# Patient Record
Sex: Female | Born: 1970 | Race: White | Hispanic: No | Marital: Single | Smoking: Never smoker
Health system: Southern US, Community
[De-identification: ages and names within clinical notes are randomized; demographics above are authoritative.]

## PROBLEM LIST (undated history)

## (undated) DIAGNOSIS — F419 Anxiety disorder, unspecified: Secondary | ICD-10-CM

## (undated) DIAGNOSIS — T8859XA Other complications of anesthesia, initial encounter: Secondary | ICD-10-CM

## (undated) DIAGNOSIS — T4145XA Adverse effect of unspecified anesthetic, initial encounter: Secondary | ICD-10-CM

## (undated) DIAGNOSIS — C50919 Malignant neoplasm of unspecified site of unspecified female breast: Secondary | ICD-10-CM

## (undated) DIAGNOSIS — Z9889 Other specified postprocedural states: Secondary | ICD-10-CM

## (undated) DIAGNOSIS — D649 Anemia, unspecified: Secondary | ICD-10-CM

## (undated) DIAGNOSIS — R112 Nausea with vomiting, unspecified: Secondary | ICD-10-CM

## (undated) DIAGNOSIS — Z923 Personal history of irradiation: Secondary | ICD-10-CM

## (undated) DIAGNOSIS — Z803 Family history of malignant neoplasm of breast: Principal | ICD-10-CM

## (undated) DIAGNOSIS — G43909 Migraine, unspecified, not intractable, without status migrainosus: Secondary | ICD-10-CM

## (undated) HISTORY — DX: Personal history of irradiation: Z92.3

## (undated) HISTORY — PX: BREAST LUMPECTOMY: SHX2

## (undated) HISTORY — DX: Malignant neoplasm of unspecified site of unspecified female breast: C50.919

## (undated) HISTORY — PX: WISDOM TOOTH EXTRACTION: SHX21

## (undated) HISTORY — PX: OTHER SURGICAL HISTORY: SHX169

## (undated) HISTORY — DX: Anemia, unspecified: D64.9

## (undated) HISTORY — DX: Migraine, unspecified, not intractable, without status migrainosus: G43.909

## (undated) HISTORY — DX: Family history of malignant neoplasm of breast: Z80.3

## (undated) HISTORY — PX: DILATION AND CURETTAGE OF UTERUS: SHX78

## (undated) HISTORY — PX: CLOSED REDUCTION ELBOW DISLOCATION: SUR215

## (undated) HISTORY — DX: Anxiety disorder, unspecified: F41.9

## (undated) SURGERY — EXCISION, LESION, BREAST
Anesthesia: General | Laterality: Right

---

## 1998-02-18 ENCOUNTER — Inpatient Hospital Stay (HOSPITAL_COMMUNITY): Admission: AD | Admit: 1998-02-18 | Discharge: 1998-02-18 | Payer: Self-pay | Admitting: Obstetrics and Gynecology

## 1998-07-05 ENCOUNTER — Ambulatory Visit (HOSPITAL_COMMUNITY): Admission: RE | Admit: 1998-07-05 | Discharge: 1998-07-05 | Payer: Self-pay | Admitting: Obstetrics and Gynecology

## 1998-07-19 ENCOUNTER — Inpatient Hospital Stay (HOSPITAL_COMMUNITY): Admission: AD | Admit: 1998-07-19 | Discharge: 1998-07-22 | Payer: Self-pay | Admitting: Obstetrics and Gynecology

## 1998-08-06 ENCOUNTER — Encounter (HOSPITAL_COMMUNITY): Admission: RE | Admit: 1998-08-06 | Discharge: 1998-09-21 | Payer: Self-pay | Admitting: Obstetrics and Gynecology

## 1998-08-20 ENCOUNTER — Other Ambulatory Visit: Admission: RE | Admit: 1998-08-20 | Discharge: 1998-08-20 | Payer: Self-pay | Admitting: Obstetrics and Gynecology

## 1999-09-16 ENCOUNTER — Other Ambulatory Visit: Admission: RE | Admit: 1999-09-16 | Discharge: 1999-09-16 | Payer: Self-pay | Admitting: Obstetrics and Gynecology

## 2000-04-18 ENCOUNTER — Encounter: Payer: Self-pay | Admitting: Emergency Medicine

## 2000-04-18 ENCOUNTER — Emergency Department (HOSPITAL_COMMUNITY): Admission: EM | Admit: 2000-04-18 | Discharge: 2000-04-18 | Payer: Self-pay | Admitting: Emergency Medicine

## 2002-01-19 ENCOUNTER — Ambulatory Visit (HOSPITAL_COMMUNITY): Admission: RE | Admit: 2002-01-19 | Discharge: 2002-01-19 | Payer: Self-pay | Admitting: Obstetrics and Gynecology

## 2002-01-19 ENCOUNTER — Encounter (INDEPENDENT_AMBULATORY_CARE_PROVIDER_SITE_OTHER): Payer: Self-pay | Admitting: Specialist

## 2002-11-28 ENCOUNTER — Other Ambulatory Visit: Admission: RE | Admit: 2002-11-28 | Discharge: 2002-11-28 | Payer: Self-pay | Admitting: Obstetrics and Gynecology

## 2003-06-06 ENCOUNTER — Inpatient Hospital Stay (HOSPITAL_COMMUNITY): Admission: AD | Admit: 2003-06-06 | Discharge: 2003-06-06 | Payer: Self-pay | Admitting: Obstetrics and Gynecology

## 2003-06-06 ENCOUNTER — Encounter: Payer: Self-pay | Admitting: Obstetrics & Gynecology

## 2003-06-09 ENCOUNTER — Inpatient Hospital Stay (HOSPITAL_COMMUNITY): Admission: AD | Admit: 2003-06-09 | Discharge: 2003-06-12 | Payer: Self-pay | Admitting: Obstetrics and Gynecology

## 2003-07-12 ENCOUNTER — Other Ambulatory Visit: Admission: RE | Admit: 2003-07-12 | Discharge: 2003-07-12 | Payer: Self-pay | Admitting: Obstetrics and Gynecology

## 2004-08-19 ENCOUNTER — Other Ambulatory Visit: Admission: RE | Admit: 2004-08-19 | Discharge: 2004-08-19 | Payer: Self-pay | Admitting: Obstetrics and Gynecology

## 2006-06-16 ENCOUNTER — Encounter: Admission: RE | Admit: 2006-06-16 | Discharge: 2006-06-16 | Payer: Self-pay | Admitting: Obstetrics and Gynecology

## 2008-11-24 ENCOUNTER — Encounter: Admission: RE | Admit: 2008-11-24 | Discharge: 2008-11-24 | Payer: Self-pay | Admitting: Family Medicine

## 2010-11-03 ENCOUNTER — Encounter: Payer: Self-pay | Admitting: Obstetrics and Gynecology

## 2011-02-28 NOTE — Op Note (Signed)
[facility]  Patient:    Ruth Nguyen, Ruth Nguyen Visit Number: [id number] MRN: [id number]          Service Type: DSU Location: [facility] Attending Physician:  Soledad Gerlach Dictated by:   Guy Sandifer Arleta Creek, M.D. Proc. Date: 01/19/02 Admit Date:  01/19/2002                             Operative Report  PREOPERATIVE DIAGNOSES:       Missed abortion.  POSTOPERATIVE DIAGNOSES:      Missed abortion.  PROCEDURE:                    Dilatation and evacuation.  SURGEON:                      Guy Sandifer. Arleta Creek, M.D.  ANESTHESIA:                   MAC with 1% Xylocaine paracervical block.  ESTIMATED BLOOD LOSS:         50 cc.  INDICATIONS AND CONSENT:      This patient is a 40 year old married white female G2, P1 found to have a missed abortion on ultrasound in the office today.  No heartbeat was noted on prolonged observation.  Crown rump length was 9 weeks and 3 days.  Options were discussed with the patient.  Dilatation and evacuation is discussed.  Potential risks and complications have been discussed including, but not limited to, infection, uterine perforation and organ damage, laparoscopy, laparotomy, bleeding requiring transfusion of blood products with possible transfusion reaction, HIV and hepatitis acquisition, DVT, PE, pneumonia, hysterectomy, intrauterine synechiae, and secondary infertility.  All questions have been answered and consent is signed on the chart.  PROCEDURE:                    Patient is taken to the operating room where she is given IV sedation.  She is placed in the dorsal lithotomy position where she is prepped and draped in a sterile fashion.  Examination reveals the uterus to be anteverted, approximately 9 weeks in size.  Cervix is closed. Bivalve speculum was placed in the vagina and the anterior cervical lip is injected with 1% Xylocaine and grasped with a single tooth tenaculum. Paracervical block is then placed  at the 2, 4, 5, 7, 8, and 10 oclock positions with approximately 20 cc total of 1% Xylocaine.  Cervix is then gently progressively dilated to a 33 Pratt dilator.  A #8 suction curettage is then placed in the intrauterine cavity and suction curettage is carried out for obvious products of conception.  Pitocin 20 units are added to 1 L of IV fluids after the initial pass of the suction curette.  Continuing suction curettage was carried out and then alternating sharp and suction curettage was carried out and the uterine cavity was clean.  Good hemostasis is noted.  All instruments are removed.  Good hemostasis is noted on the cervix.  All counts are correct.  The patient is awakened and taken to recovery room in stable condition.  Patient is Rh positive.  She will be discharged to home on Methergine 0.2 mg #6 one p.o. t.i.d.  She did receive Ancef 1 g preoperatively. Dictated by:   Guy Sandifer Arleta Creek, M.D. Attending Physician:  Soledad Gerlach DD:  01/19/02 TD:  01/20/02 Job: [id number]  [id number]

## 2011-02-28 NOTE — H&P (Signed)
[facility]  Patient:    Ruth Nguyen, Ruth Nguyen Visit Number: [id number] MRN: [id number]          Service Type: DSU Location: [facility] Attending Physician:  Soledad Gerlach Dictated by:   Guy Sandifer Arleta Creek, M.D. Admit Date:  01/19/2002                           History and Physical  CHIEF COMPLAINT:              Missed abortion.  HISTORY OF PRESENT ILLNESS:   This patient is a 40 year old married white female, G2, P1, with a last menstrual period somewhere in the end of January. She had scant bleeding at the beginning of pregnancy.  Upon presentation of her new OB visit today, she was noted to have crown rumpling of 9 weeks and three days.  No fetal heart beat was noted upon prolonged observation. Diagnosis of miscarriage was made.  Options were discussed.  The patient scheduled for dilatation and evacuation.  The potential risks and complications have been discussed, and all questions have been answered.  PAST MEDICAL HISTORY:         1. Superficial varicosities.                               2. History of iron deficiency anemia.                               3. Irritable bowel syndrome.                               4. Migraine headaches.  PAST SURGICAL HISTORY:        Upper GI with endoscopy in 1998.  OBSTETRIC HISTORY:            Vaginal delivery x1.  FAMILY HISTORY:               Positive for superficial varicosities in maternal grandmother and maternal aunt.  Borderline diabetes in maternal grandfather.  Rheumatoid arthritis in maternal aunt.  Stroke in maternal grandfather, paternal grandmother, paternal grandfather.  Parkinsons disease in maternal grandfather and paternal grandmother.  Alzheimers disease in maternal great-grandmother.  B12 deficiency in patients mother, maternal grandmother and all maternal aunts.  SOCIAL HISTORY:               The patient is employed as a Armed forces training and education officer.  Denies tobacco, alcohol or drug  abuse.  MEDICATIONS:                  Prenatal vitamins.  ALLERGIES:                    No known drug allergies.  REVIEW OF SYSTEMS             Negative, except as above.  PHYSICAL EXAMINATION:  VITAL SIGNS:                  Height 5 feet 4.5 inches.  Blood pressure 110/70.  LUNGS:                        Clear to auscultation.  HEART:  Regular rate and rhythm.  BACK:                         Without CVA tenderness.  BREASTS:                      Not examined.  ABDOMEN:                      Nontender without masses.  PELVIC:                       Deferred until surgery.  EXTREMITIES:                  Grossly within normal limits.  NEUROLOGIC:                   Grossly within normal limits.  LABORATORY:                   Blood type 0 positive.  ASSESSMENT:                   Missed abortion.  PLAN:                         Dilatation and evacuation. Dictated by:   Guy Sandifer Arleta Creek, M.D. Attending Physician:  Soledad Gerlach DD:  01/19/02 TD:  01/19/02 Job: [id number]

## 2011-08-08 ENCOUNTER — Other Ambulatory Visit: Payer: Self-pay | Admitting: Family Medicine

## 2011-08-08 DIAGNOSIS — R0989 Other specified symptoms and signs involving the circulatory and respiratory systems: Secondary | ICD-10-CM

## 2011-08-14 ENCOUNTER — Ambulatory Visit
Admission: RE | Admit: 2011-08-14 | Discharge: 2011-08-14 | Disposition: A | Discharge status: Home / Self Care | Payer: BC Managed Care – PPO | Source: Ambulatory Visit | Attending: Family Medicine | Admitting: Family Medicine

## 2011-08-14 DIAGNOSIS — R0989 Other specified symptoms and signs involving the circulatory and respiratory systems: Secondary | ICD-10-CM

## 2012-06-05 ENCOUNTER — Ambulatory Visit (INDEPENDENT_AMBULATORY_CARE_PROVIDER_SITE_OTHER): Payer: BC Managed Care – PPO | Admitting: Emergency Medicine

## 2012-06-05 VITALS — BP 112/80 | HR 85 | Temp 98.1°F | Resp 18 | Ht 65.5 in | Wt 223.0 lb

## 2012-06-05 DIAGNOSIS — J018 Other acute sinusitis: Secondary | ICD-10-CM

## 2012-06-05 MED ORDER — CEFPROZIL 500 MG PO TABS: 500.0000 mg | ORAL_TABLET | Freq: Two times a day (BID) | ORAL | Status: AC

## 2012-06-05 MED ORDER — PSEUDOEPHEDRINE-GUAIFENESIN ER 60-600 MG PO TB12: 1.0000 | ORAL_TABLET | Freq: Two times a day (BID) | ORAL | Status: AC

## 2012-06-05 NOTE — Progress Notes (Signed)
   Date:  06/05/2012   Name:  Ruth Nguyen   DOB:  04-Aug-1971   MRN:  [id number] Gender: female  Age: 41 y.o.  PCP:  Ricci Barker, PA    Chief Complaint: Headache, Sore Throat and Otalgia   History of Present Illness:  Ruth Nguyen is a 41 y.o. pleasant patient who presents with the following:  History of migraines and has headache past several days that has improved with vicodin.  Has sore throat, hoarseness and pain and pressure in ears.  Sore neck with swollen lymph nodes.  No fever or chills or nausea or vomiting.  No neuro or visual symptoms.  Symptoms have improved with otc meds.  Myalgias and arthralgias.  There is no problem list on file for this patient.   No past medical history on file.  No past surgical history on file.  History  Substance Use Topics  . Smoking status: Never Smoker   . Smokeless tobacco: Not on file  . Alcohol Use: Not on file    No family history on file.  Allergies  Allergen Reactions  . Penicillins     Medication list has been reviewed and updated.  Current Outpatient Prescriptions on File Prior to Visit  Medication Sig Dispense Refill  . buPROPion (WELLBUTRIN SR) 100 MG 12 hr tablet Take 100 mg by mouth 2 (two) times daily.      . sertraline (ZOLOFT) 20 MG/ML concentrated solution Take 10 mg by mouth daily.      . SUMAtriptan (IMITREX) 25 MG tablet Take 25 mg by mouth every 2 (two) hours as needed.        Review of Systems:  As per HPI, otherwise negative.    Physical Examination: Filed Vitals:   06/05/12 1519  BP: 112/80  Pulse: 85  Temp: 98.1 F (36.7 C)  Resp: 18   Filed Vitals:   06/05/12 1519  Height: 5' 5.5" (1.664 m)  Weight: 223 lb (101.152 kg)   Body mass index is 36.54 kg/(m^2). Ideal Body Weight: Weight in (lb) to have BMI = 25: 152.2    GEN: WDWN, NAD, Non-toxic, Alert & Oriented x 3 HEENT: Atraumatic, Normocephalic. Tender over maxillary sinuses.  Oropharynx normal with postpharyngeal  purulent drainage Ears and Nose: No external deformity.  TM's normal. EXTR: No clubbing/cyanosis/edema NEURO: Normal gait.  PSYCH: Normally interactive. Conversant. Not depressed or anxious appearing.  Calm demeanor.  Chest CTA   Assessment and Plan: Sinusitis cefzil  mucinex d Follow up as needed   Carmelina Dane, MD

## 2012-09-01 ENCOUNTER — Other Ambulatory Visit: Payer: Self-pay | Admitting: Obstetrics and Gynecology

## 2012-12-26 ENCOUNTER — Ambulatory Visit (INDEPENDENT_AMBULATORY_CARE_PROVIDER_SITE_OTHER): Payer: BC Managed Care – PPO | Admitting: Family Medicine

## 2012-12-26 VITALS — HR 89 | Temp 98.3°F | Resp 19 | Ht 64.25 in | Wt 223.4 lb

## 2012-12-26 VITALS — BP 114/85 | HR 89 | Temp 98.3°F | Resp 18 | Ht 64.25 in | Wt 223.4 lb

## 2012-12-26 DIAGNOSIS — K120 Recurrent oral aphthae: Secondary | ICD-10-CM

## 2012-12-26 DIAGNOSIS — H9209 Otalgia, unspecified ear: Secondary | ICD-10-CM

## 2012-12-26 MED ORDER — FIRST-DUKES MOUTHWASH MT SUSP: 10.0000 mL | OROMUCOSAL | Status: DC | PRN

## 2012-12-26 NOTE — Progress Notes (Signed)
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  Objective: There is a half centimeter long ulcer underneath her left tongue with corresponding left-sided submandibular adenopathy. TM is normal. Skin is normal. Oropharynx is otherwise unremarkable  Assessment: Aphthous ulcer  Plan: Dukes mouthwash.

## 2012-12-26 NOTE — Progress Notes (Signed)
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 The last 2 days she's developed a sore in her left tongue with radiation into the ear and down the neck. She's had no fever, cough, or change in appetite. She has noted some in intermittent epistaxis.  Objective: HEENT is unremarkable except for the ulcerative left tongue which is about 1-2 cm long and 4-5 mm wide. She has some mild adenopathy in her neck which is supple.  Assessment: Aphthous ulcer  Plan: Dukes mouth wash

## 2015-02-27 ENCOUNTER — Other Ambulatory Visit: Payer: Self-pay | Admitting: Obstetrics and Gynecology

## 2015-02-28 LAB — CYTOLOGY - PAP

## 2015-04-03 ENCOUNTER — Ambulatory Visit: Payer: BC Managed Care – PPO

## 2017-03-05 ENCOUNTER — Other Ambulatory Visit: Payer: Self-pay | Admitting: Obstetrics and Gynecology

## 2017-03-05 DIAGNOSIS — N644 Mastodynia: Secondary | ICD-10-CM

## 2017-03-13 HISTORY — PX: BREAST BIOPSY: SHX20

## 2017-03-19 ENCOUNTER — Other Ambulatory Visit: Payer: Self-pay | Admitting: Obstetrics and Gynecology

## 2017-03-19 ENCOUNTER — Ambulatory Visit
Admission: RE | Admit: 2017-03-19 | Discharge: 2017-03-19 | Disposition: A | Discharge status: Home / Self Care | Payer: BC Managed Care – PPO | Source: Ambulatory Visit | Attending: Obstetrics and Gynecology | Admitting: Obstetrics and Gynecology

## 2017-03-19 ENCOUNTER — Other Ambulatory Visit: Payer: BC Managed Care – PPO

## 2017-03-19 DIAGNOSIS — N6310 Unspecified lump in the right breast, unspecified quadrant: Secondary | ICD-10-CM

## 2017-03-19 DIAGNOSIS — N644 Mastodynia: Secondary | ICD-10-CM

## 2017-03-23 ENCOUNTER — Ambulatory Visit
Admission: RE | Admit: 2017-03-23 | Discharge: 2017-03-23 | Disposition: A | Discharge status: Home / Self Care | Payer: BC Managed Care – PPO | Source: Ambulatory Visit | Attending: Obstetrics and Gynecology | Admitting: Obstetrics and Gynecology

## 2017-03-23 ENCOUNTER — Other Ambulatory Visit: Payer: Self-pay | Admitting: Obstetrics and Gynecology

## 2017-03-23 DIAGNOSIS — N6310 Unspecified lump in the right breast, unspecified quadrant: Secondary | ICD-10-CM

## 2017-03-24 ENCOUNTER — Telehealth: Payer: Self-pay | Admitting: *Deleted

## 2017-03-24 NOTE — Telephone Encounter (Signed)
Confirmed [facility] for 04/01/17 at 8:15am .  Instructions and contact information given.

## 2017-03-31 ENCOUNTER — Other Ambulatory Visit: Payer: Self-pay | Admitting: *Deleted

## 2017-03-31 DIAGNOSIS — Z170 Estrogen receptor positive status [ER+]: Principal | ICD-10-CM

## 2017-03-31 DIAGNOSIS — C50311 Malignant neoplasm of lower-inner quadrant of right female breast: Secondary | ICD-10-CM

## 2017-04-01 ENCOUNTER — Ambulatory Visit
Admission: RE | Admit: 2017-04-01 | Discharge: 2017-04-01 | Disposition: A | Discharge status: Home / Self Care | Payer: BC Managed Care – PPO | Source: Ambulatory Visit | Attending: Radiation Oncology | Admitting: Radiation Oncology

## 2017-04-01 ENCOUNTER — Telehealth: Payer: Self-pay | Admitting: *Deleted

## 2017-04-01 ENCOUNTER — Ambulatory Visit: Payer: Self-pay | Admitting: General Surgery

## 2017-04-01 ENCOUNTER — Encounter: Payer: Self-pay | Admitting: Oncology

## 2017-04-01 ENCOUNTER — Other Ambulatory Visit: Payer: BC Managed Care – PPO

## 2017-04-01 ENCOUNTER — Encounter: Payer: Self-pay | Admitting: Physical Therapy

## 2017-04-01 ENCOUNTER — Ambulatory Visit: Payer: BC Managed Care – PPO | Attending: General Surgery | Admitting: Physical Therapy

## 2017-04-01 ENCOUNTER — Ambulatory Visit (HOSPITAL_BASED_OUTPATIENT_CLINIC_OR_DEPARTMENT_OTHER): Payer: BC Managed Care – PPO | Admitting: Oncology

## 2017-04-01 ENCOUNTER — Other Ambulatory Visit (HOSPITAL_BASED_OUTPATIENT_CLINIC_OR_DEPARTMENT_OTHER): Payer: BC Managed Care – PPO

## 2017-04-01 ENCOUNTER — Ambulatory Visit (HOSPITAL_BASED_OUTPATIENT_CLINIC_OR_DEPARTMENT_OTHER): Payer: BC Managed Care – PPO | Admitting: Genetics

## 2017-04-01 ENCOUNTER — Encounter: Payer: Self-pay | Admitting: Genetic Counselor

## 2017-04-01 VITALS — BP 127/74 | HR 92 | Temp 98.3°F | Resp 19 | Ht 64.25 in | Wt 223.2 lb

## 2017-04-01 DIAGNOSIS — M25521 Pain in right elbow: Secondary | ICD-10-CM | POA: Insufficient documentation

## 2017-04-01 DIAGNOSIS — Z803 Family history of malignant neoplasm of breast: Secondary | ICD-10-CM | POA: Insufficient documentation

## 2017-04-01 DIAGNOSIS — C50311 Malignant neoplasm of lower-inner quadrant of right female breast: Secondary | ICD-10-CM

## 2017-04-01 DIAGNOSIS — Z880 Allergy status to penicillin: Secondary | ICD-10-CM | POA: Insufficient documentation

## 2017-04-01 DIAGNOSIS — Z170 Estrogen receptor positive status [ER+]: Principal | ICD-10-CM

## 2017-04-01 DIAGNOSIS — Z79899 Other long term (current) drug therapy: Secondary | ICD-10-CM | POA: Insufficient documentation

## 2017-04-01 DIAGNOSIS — C50511 Malignant neoplasm of lower-outer quadrant of right female breast: Secondary | ICD-10-CM

## 2017-04-01 DIAGNOSIS — Z809 Family history of malignant neoplasm, unspecified: Secondary | ICD-10-CM

## 2017-04-01 DIAGNOSIS — G43909 Migraine, unspecified, not intractable, without status migrainosus: Secondary | ICD-10-CM | POA: Insufficient documentation

## 2017-04-01 DIAGNOSIS — Z825 Family history of asthma and other chronic lower respiratory diseases: Secondary | ICD-10-CM | POA: Insufficient documentation

## 2017-04-01 DIAGNOSIS — Z510 Encounter for antineoplastic radiation therapy: Secondary | ICD-10-CM | POA: Insufficient documentation

## 2017-04-01 DIAGNOSIS — Z9889 Other specified postprocedural states: Secondary | ICD-10-CM | POA: Insufficient documentation

## 2017-04-01 DIAGNOSIS — Z8261 Family history of arthritis: Secondary | ICD-10-CM | POA: Insufficient documentation

## 2017-04-01 DIAGNOSIS — Z800 Family history of malignant neoplasm of digestive organs: Secondary | ICD-10-CM | POA: Insufficient documentation

## 2017-04-01 DIAGNOSIS — R293 Abnormal posture: Secondary | ICD-10-CM | POA: Diagnosis present

## 2017-04-01 DIAGNOSIS — Z823 Family history of stroke: Secondary | ICD-10-CM | POA: Insufficient documentation

## 2017-04-01 DIAGNOSIS — Z79891 Long term (current) use of opiate analgesic: Secondary | ICD-10-CM | POA: Insufficient documentation

## 2017-04-01 DIAGNOSIS — Z818 Family history of other mental and behavioral disorders: Secondary | ICD-10-CM | POA: Insufficient documentation

## 2017-04-01 DIAGNOSIS — M25621 Stiffness of right elbow, not elsewhere classified: Secondary | ICD-10-CM | POA: Insufficient documentation

## 2017-04-01 HISTORY — DX: Family history of malignant neoplasm of breast: Z80.3

## 2017-04-01 LAB — CBC WITH DIFFERENTIAL/PLATELET
BASO%: 0.5 % (ref 0.0–2.0)
Basophils Absolute: 0 10*3/uL (ref 0.0–0.1)
EOS%: 1.9 % (ref 0.0–7.0)
Eosinophils Absolute: 0.1 10*3/uL (ref 0.0–0.5)
HCT: 40.2 % (ref 34.8–46.6)
HEMOGLOBIN: 13.2 g/dL (ref 11.6–15.9)
LYMPH%: 29.5 % (ref 14.0–49.7)
MCH: 29.5 pg (ref 25.1–34.0)
MCHC: 32.8 g/dL (ref 31.5–36.0)
MCV: 90.1 fL (ref 79.5–101.0)
MONO#: 0.5 10*3/uL (ref 0.1–0.9)
MONO%: 9.3 % (ref 0.0–14.0)
NEUT%: 58.8 % (ref 38.4–76.8)
NEUTROS ABS: 3.4 10*3/uL (ref 1.5–6.5)
PLATELETS: 328 10*3/uL (ref 145–400)
RBC: 4.47 10*6/uL (ref 3.70–5.45)
RDW: 13.6 % (ref 11.2–14.5)
WBC: 5.8 10*3/uL (ref 3.9–10.3)
lymph#: 1.7 10*3/uL (ref 0.9–3.3)

## 2017-04-01 LAB — COMPREHENSIVE METABOLIC PANEL
ALK PHOS: 87 U/L (ref 40–150)
ALT: 19 U/L (ref 0–55)
ANION GAP: 8 meq/L (ref 3–11)
AST: 17 U/L (ref 5–34)
Albumin: 3.5 g/dL (ref 3.5–5.0)
BILIRUBIN TOTAL: 0.39 mg/dL (ref 0.20–1.20)
BUN: 9.3 mg/dL (ref 7.0–26.0)
CO2: 27 meq/L (ref 22–29)
CREATININE: 0.7 mg/dL (ref 0.6–1.1)
Calcium: 9.2 mg/dL (ref 8.4–10.4)
Chloride: 104 mEq/L (ref 98–109)
GLUCOSE: 79 mg/dL (ref 70–140)
Potassium: 4 mEq/L (ref 3.5–5.1)
SODIUM: 139 meq/L (ref 136–145)
TOTAL PROTEIN: 7.6 g/dL (ref 6.4–8.3)

## 2017-04-01 MED ORDER — TAMOXIFEN CITRATE 20 MG PO TABS: 20.0000 mg | ORAL_TABLET | Freq: Every day | ORAL | 12 refills | Status: DC

## 2017-04-01 NOTE — Progress Notes (Signed)
Radiation Oncology         [phone] ________________________________  Initial Outpatient Consultation  Name: Ruth Nguyen MRN: [id number]  Date: 04/01/2017  DOB: April 17, 1971  ME:[id number], Gladis Riffle, MD   REFERRING PHYSICIAN: Excell Seltzer, MD  DIAGNOSIS: 46 year-old woman with invasive ductal carcinoma of the right breast, Grade 1, ER+/ PR+/ Her-2 negative/ Ki-67 5%   The encounter diagnosis was Malignant neoplasm of lower-inner quadrant of right breast of female, estrogen receptor positive ([place]).  CHIEF COMPLAINT: Here to discuss management of right breast cancer  HISTORY OF PRESENT ILLNESS::Ruth Nguyen is a 46 y.o. female who complained of focal pain within the inner left breast at the 10 o'clock axis for one month. Accordingly, a diagnostic mammogram and ultrasound were performed on 03/19/2017. These scans revealed an irregular hypoechoic mass within the right breast at the 4 o'clock axis, 7 cm from the nipple, measuring 9 mm. There was no evidence of malignancy seen in the left breast.   Biopsy of the right breast at 4 o'clock position was performed on 03/23/2017. This revealed invasive ductal carcinoma, grade 1, ER 95% / PR 100% / HER-2 negative / Ki-67 5%.  The patient was seen today in our multidisciplinary breast clinic.  Family history of cancer includes paternal grandmother with breast cancer and paternal grandfather with stomach or colon cancer.  PREVIOUS RADIATION THERAPY: No  PAST MEDICAL HISTORY:  has a past medical history of Anemia; Anxiety; and Migraines.    PAST SURGICAL HISTORY: Past Surgical History:  Procedure Laterality Date  . CLOSED REDUCTION ELBOW DISLOCATION    . miscarriage      FAMILY HISTORY: family history includes ADD / ADHD in her daughter and son; Asthma in her son; Breast cancer in her paternal grandmother; Cancer in her paternal grandfather; Fibromyalgia in her mother; Mental illness in her sister;  Osteoporosis in her maternal grandmother; Parkinson's disease in her maternal grandfather; Stroke in her paternal grandfather.  SOCIAL HISTORY:  reports that she has never smoked. She does not have any smokeless tobacco history on file. She reports that she does not drink alcohol or use drugs.  ALLERGIES: Penicillins  MEDICATIONS:  Current Outpatient Prescriptions  Medication Sig Dispense Refill  . albuterol (ACCUNEB) 0.63 MG/3ML nebulizer solution Take 1 ampule by nebulization every 6 (six) hours as needed for wheezing.    Marland Kitchen amitriptyline (ELAVIL) 25 MG tablet Take 25 mg by mouth daily.  1  . SUMAtriptan (IMITREX) 25 MG tablet Take 25 mg by mouth every 2 (two) hours as needed.     No current facility-administered medications for this encounter.    Gynecologic History  Age at first menstrual period? 5th grade, 10-13?  Are you still having periods? Yes Approximate date of last period? Beginning of June  If you are still having periods: Are your periods regular? Yes  If you no longer have periods: Have you used hormone replacement? N/A  If YES, for how long? N/A When did you stop? N/A Obstetric History:  How many children have you carried to term? 2 Your age at first live birth? 44  Pregnant now or trying to get pregnant? No  Have you used birth control pills or hormone shots for contraception? Yes  If so, for how long (or approximate dates)? ~briefly, caused increase in headaches after marriage 1994-1995  Would you be interested in learning more about the options to preserve fertility? No Health Maintenance:  Have you ever had a colonoscopy? No If yes, date?  N/A  Have you ever had a bone density? Yes If yes, date? 2x - [facility]  Date of your last PAP smear? 03/04/17 Date of your FIRST mammogram? ? Multiple 3D mammograms on file.   REVIEW OF SYSTEMS:  On review of systems, the patient reports that she is doing well overall. She reports fatigue. She denies any chest pain,  shortness of breath, cough, fevers, chills, night sweats, unintended weight changes. She denies any bowel or bladder disturbances, and denies abdominal pain, nausea or vomiting. She reports pain in left breast. She reports anxiety. A complete review of systems is obtained and is otherwise negative.   PHYSICAL EXAM:  Vitals with BMI 04/01/2017  Height 5' 4.25"  Weight 223 lbs 3 oz  BMI 81.1  Systolic 914  Diastolic 74  Pulse 92  Respirations 19   General: Alert and oriented, in no acute distress HEENT: Head is normocephalic. Extraocular movements are intact. Oropharynx is clear. Neck: Neck is supple, no palpable cervical or supraclavicular lymphadenopathy. Heart: Regular in rate and rhythm with no murmurs, rubs, or gallops. Chest: Clear to auscultation bilaterally, with no rhonchi, wheezes, or rales. Abdomen: Soft, nontender, nondistended, with no rigidity or guarding. Extremities: No cyanosis or edema. Lymphatics: see Neck Exam Skin: No concerning lesions. Musculoskeletal: symmetric strength and muscle tone throughout. Neurologic: Cranial nerves II through XII are grossly intact. No obvious focalities. Speech is fluent. Coordination is intact. Psychiatric: Judgment and insight are intact. Affect is appropriate. Breast: Left breast no palpable mass or nipple discharge. Right breast shows bruising in the LIQ. Patient also has a band aid and steri-strips in place approximately in the 7 o'clock position of the breast (entrance site for biopsy). No palpable mass, nipple discharge or bleeding.   ECOG = 0  0 - Asymptomatic (Fully active, able to carry on all predisease activities without restriction)  1 - Symptomatic but completely ambulatory (Restricted in physically strenuous activity but ambulatory and able to carry out work of a light or sedentary nature. For example, light housework, office work)  2 - Symptomatic, <50% in bed during the day (Ambulatory and capable of all self care but  unable to carry out any work activities. Up and about more than 50% of waking hours)  3 - Symptomatic, >50% in bed, but not bedbound (Capable of only limited self-care, confined to bed or chair 50% or more of waking hours)  4 - Bedbound (Completely disabled. Cannot carry on any self-care. Totally confined to bed or chair)  5 - Death   Eustace Pen MM, Creech RH, Tormey DC, et al. [phone]). "Toxicity and response criteria of the [facility]". [employer] Oncol. 5 (6): 649-55  LABORATORY DATA:  Lab Results  Component Value Date   WBC 5.8 04/01/2017   HGB 13.2 04/01/2017   HCT 40.2 04/01/2017   MCV 90.1 04/01/2017   PLT 328 04/01/2017   NEUTROABS 3.4 04/01/2017   Lab Results  Component Value Date   NA 139 04/01/2017   K 4.0 04/01/2017   CO2 27 04/01/2017   GLUCOSE 79 04/01/2017   CREATININE 0.7 04/01/2017   CALCIUM 9.2 04/01/2017      RADIOGRAPHY: US Breast Ltd Uni Left Inc Axilla  Result Date: 03/19/2017 CLINICAL DATA:  Patient describes focal pain within the inner left breast at the 10 o'clock axis for 1 month. EXAM: 2D DIGITAL DIAGNOSTIC BILATERAL MAMMOGRAM WITH CAD AND ADJUNCT TOMO ULTRASOUND BILATERAL BREAST COMPARISON:  Previous exam(s). ACR Breast Density Category b:  There are scattered areas of fibroglandular density. FINDINGS: Bilateral 2D CC and MLO projections were obtained today, with additional 3D tomosynthesis, and with additional spot compression view of the inner left breast corresponding to the area of clinical concern, with overlying skin marker in place. Right breast: There is an asymmetry within the lower inner quadrant of the right breast, with possible associated distortion, persistent with spot compression and rolled views, tomosynthesis MLO slice 47, tomosynthesis CC slice 69, tomosynthesis spot compression CC slice 56. Left breast: There are no dominant masses, suspicious calcifications or secondary signs of malignancy within the left breast.  Specifically, there is no mammographic abnormality within the inner left breast corresponding to the area of breast pain. Mammographic images were processed with CAD. Right breast: Targeted ultrasound is performed, evaluating the lower inner quadrant of the right breast corresponding to the mammographic finding, showing an irregular hypoechoic mass at the 4 o'clock axis, 7 cm from the nipple, measuring 9 x 4 x 6 mm, avascular, a likely correlate for the mammographic finding. Left breast: Next, targeted ultrasound is performed of the upper inner quadrant of the left breast, with particular attention to the 10 o'clock axis as directed by the patient, showing only normal fibroglandular tissues and fat lobules. No suspicious solid or cystic mass is identified. Right axilla was evaluated with ultrasound showing no enlarged or morphologically abnormal lymph nodes. IMPRESSION: 1. Irregular hypoechoic mass within the right breast at the 4 o'clock axis, 7 cm from the nipple, measuring 9 mm, a likely correlate for the asymmetry with possible associated distortion seen on mammogram. This is a suspicious finding for which ultrasound-guided biopsy is recommended. 2. No evidence of malignancy within the left breast. RECOMMENDATION: 1. Ultrasound-guided biopsy of the RIGHT breast mass at the 4 o'clock axis. 2. Postprocedure mammogram to ensure sonographic and mammographic correspondence. Ultrasound-guided biopsy is scheduled for June 11th. I have discussed the findings and recommendations with the patient. Results were also provided in writing at the conclusion of the visit. If applicable, a reminder letter will be sent to the patient regarding the next appointment. BI-RADS CATEGORY  4: Suspicious. Electronically Signed   By: Franki Cabot M.D.   On: 03/19/2017 11:21   US Breast Ltd Uni Right Inc Axilla  Result Date: 03/19/2017 CLINICAL DATA:  Patient describes focal pain within the inner left breast at the 10 o'clock axis for  1 month. EXAM: 2D DIGITAL DIAGNOSTIC BILATERAL MAMMOGRAM WITH CAD AND ADJUNCT TOMO ULTRASOUND BILATERAL BREAST COMPARISON:  Previous exam(s). ACR Breast Density Category b: There are scattered areas of fibroglandular density. FINDINGS: Bilateral 2D CC and MLO projections were obtained today, with additional 3D tomosynthesis, and with additional spot compression view of the inner left breast corresponding to the area of clinical concern, with overlying skin marker in place. Right breast: There is an asymmetry within the lower inner quadrant of the right breast, with possible associated distortion, persistent with spot compression and rolled views, tomosynthesis MLO slice 47, tomosynthesis CC slice 69, tomosynthesis spot compression CC slice 56. Left breast: There are no dominant masses, suspicious calcifications or secondary signs of malignancy within the left breast. Specifically, there is no mammographic abnormality within the inner left breast corresponding to the area of breast pain. Mammographic images were processed with CAD. Right breast: Targeted ultrasound is performed, evaluating the lower inner quadrant of the right breast corresponding to the mammographic finding, showing an irregular hypoechoic mass at the 4 o'clock axis, 7 cm from the nipple, measuring 9 x  4 x 6 mm, avascular, a likely correlate for the mammographic finding. Left breast: Next, targeted ultrasound is performed of the upper inner quadrant of the left breast, with particular attention to the 10 o'clock axis as directed by the patient, showing only normal fibroglandular tissues and fat lobules. No suspicious solid or cystic mass is identified. Right axilla was evaluated with ultrasound showing no enlarged or morphologically abnormal lymph nodes. IMPRESSION: 1. Irregular hypoechoic mass within the right breast at the 4 o'clock axis, 7 cm from the nipple, measuring 9 mm, a likely correlate for the asymmetry with possible associated distortion  seen on mammogram. This is a suspicious finding for which ultrasound-guided biopsy is recommended. 2. No evidence of malignancy within the left breast. RECOMMENDATION: 1. Ultrasound-guided biopsy of the RIGHT breast mass at the 4 o'clock axis. 2. Postprocedure mammogram to ensure sonographic and mammographic correspondence. Ultrasound-guided biopsy is scheduled for June 11th. I have discussed the findings and recommendations with the patient. Results were also provided in writing at the conclusion of the visit. If applicable, a reminder letter will be sent to the patient regarding the next appointment. BI-RADS CATEGORY  4: Suspicious. Electronically Signed   By: Franki Cabot M.D.   On: 03/19/2017 11:21   Mm Diag Breast Tomo Bilateral  Result Date: 03/19/2017 CLINICAL DATA:  Patient describes focal pain within the inner left breast at the 10 o'clock axis for 1 month. EXAM: 2D DIGITAL DIAGNOSTIC BILATERAL MAMMOGRAM WITH CAD AND ADJUNCT TOMO ULTRASOUND BILATERAL BREAST COMPARISON:  Previous exam(s). ACR Breast Density Category b: There are scattered areas of fibroglandular density. FINDINGS: Bilateral 2D CC and MLO projections were obtained today, with additional 3D tomosynthesis, and with additional spot compression view of the inner left breast corresponding to the area of clinical concern, with overlying skin marker in place. Right breast: There is an asymmetry within the lower inner quadrant of the right breast, with possible associated distortion, persistent with spot compression and rolled views, tomosynthesis MLO slice 47, tomosynthesis CC slice 69, tomosynthesis spot compression CC slice 56. Left breast: There are no dominant masses, suspicious calcifications or secondary signs of malignancy within the left breast. Specifically, there is no mammographic abnormality within the inner left breast corresponding to the area of breast pain. Mammographic images were processed with CAD. Right breast: Targeted  ultrasound is performed, evaluating the lower inner quadrant of the right breast corresponding to the mammographic finding, showing an irregular hypoechoic mass at the 4 o'clock axis, 7 cm from the nipple, measuring 9 x 4 x 6 mm, avascular, a likely correlate for the mammographic finding. Left breast: Next, targeted ultrasound is performed of the upper inner quadrant of the left breast, with particular attention to the 10 o'clock axis as directed by the patient, showing only normal fibroglandular tissues and fat lobules. No suspicious solid or cystic mass is identified. Right axilla was evaluated with ultrasound showing no enlarged or morphologically abnormal lymph nodes. IMPRESSION: 1. Irregular hypoechoic mass within the right breast at the 4 o'clock axis, 7 cm from the nipple, measuring 9 mm, a likely correlate for the asymmetry with possible associated distortion seen on mammogram. This is a suspicious finding for which ultrasound-guided biopsy is recommended. 2. No evidence of malignancy within the left breast. RECOMMENDATION: 1. Ultrasound-guided biopsy of the RIGHT breast mass at the 4 o'clock axis. 2. Postprocedure mammogram to ensure sonographic and mammographic correspondence. Ultrasound-guided biopsy is scheduled for June 11th. I have discussed the findings and recommendations with the patient. Results  were also provided in writing at the conclusion of the visit. If applicable, a reminder letter will be sent to the patient regarding the next appointment. BI-RADS CATEGORY  4: Suspicious. Electronically Signed   By: Franki Cabot M.D.   On: 03/19/2017 11:21   Mm Clip Placement Right  Result Date: 03/23/2017 CLINICAL DATA:  Status post ultrasound-guided core biopsy of a right breast mass. EXAM: DIAGNOSTIC RIGHT MAMMOGRAM POST ULTRASOUND BIOPSY COMPARISON:  Previous exam(s). FINDINGS: Mammographic images were obtained following ultrasound guided biopsy of a mass in the lower inner quadrant of the right  breast. Mammographic images show there is a ribbon shaped clip in the lower inner quadrant of the right breast. IMPRESSION: Status post ultrasound-guided core biopsy of the right breast with pathology pending. Final Assessment: Post Procedure Mammograms for Marker Placement Electronically Signed   By: Lillia Mountain M.D.   On: 03/23/2017 16:54   [place] Rt Breast Bx W Loc Dev 1st Lesion Img Bx Spec US Guide  Addendum Date: 03/25/2017   ADDENDUM REPORT: 03/24/2017 13:58 ADDENDUM: Pathology revealed grade I invasive ductal carcinoma in the RIGHT breast. This was found to be concordant by Dr. Lillia Mountain. Pathology results were discussed with the patient by telephone. The patient reported doing well after the biopsy with tenderness at the site. Post biopsy instructions and care were reviewed and questions were answered. The patient was encouraged to call [facility] for any additional concerns. The patient was referred to the [facility] at the [facility] on April 01, 2017. Pathology results reported by Susa Raring RN, BSN on 03/24/2017. Electronically Signed   By: Lillia Mountain M.D.   On: 03/24/2017 13:58   Result Date: 03/25/2017 CLINICAL DATA:  Right breast mass. EXAM: ULTRASOUND GUIDED RIGHT BREAST CORE NEEDLE BIOPSY COMPARISON:  Previous exam(s). PROCEDURE: I met with the patient and we discussed the procedure of ultrasound-guided biopsy, including benefits and alternatives. We discussed the high likelihood of a successful procedure. We discussed the risks of the procedure including infection, bleeding, tissue injury, clip migration, and inadequate sampling. Informed written consent was given. The usual time-out protocol was performed immediately prior to the procedure. Lesion quadrant: Lower-inner quadrant Using sterile technique and 1% Lidocaine as local anesthetic, under direct ultrasound visualization, a 12 gauge vacuum-assisted device was  used to perform biopsy of a mass in the 4 o'clock region of the right breastusing a lateral to medial approach. At the conclusion of the procedure, a ribbon shaped tissue marker clip was deployed into the biopsy cavity. Follow-up 2-view mammogram was performed and dictated separately. IMPRESSION: Ultrasound-guided biopsy of the right breast. No apparent complications. Electronically Signed: By: Lillia Mountain M.D. On: 03/23/2017 16:51      IMPRESSION: 46 year-old woman with invasive ductal carcinoma of the right breast, Grade 1, ER+/ PR+/ Her-2 negative/ Ki-67 5%.  She would be an excellent candidate for breast conservation therapy with radiation therapy directed at the right breast. We discussed the course of treatment, side effects, and potential toxicities. She appears interested in this approach. The patient does have a family history with malignancy and will proceed with genetic evaluation this afternoon.   PLAN: Right lumpectomy and sentinel node procedure. Oncotype to determine the potential benefit of chemotherapy. Adjuvant radiotherapy. Tamoxifen.       ------------------------------------------------  Blair Promise, PhD, MD  This document serves as a record of services personally performed by Gery Pray, MD. It was created on  his behalf by Arlyce Harman, a trained medical scribe. The creation of this record is based on the scribe's personal observations and the provider's statements to them. This document has been checked and approved by the attending provider.

## 2017-04-01 NOTE — Therapy (Signed)
[place], [place], [address] Phone: [phone]   Fax:  [phone]  Physical Therapy Evaluation  Patient Details  Name: Jalani Cullifer MRN: [id number] Date of Birth: Apr 27, 1971 Referring Provider: Dr. Excell Seltzer  Encounter Date: 04/01/2017      PT End of Session - 04/01/17 1159    Visit Number 1   Number of Visits 1   PT Start Time 1103   PT Stop Time 1140   PT Time Calculation (min) 37 min   Activity Tolerance Patient tolerated treatment well   Behavior During Therapy [facility] for tasks assessed/performed      Past Medical History:  Diagnosis Date  . Anemia   . Anxiety   . Migraines     Past Surgical History:  Procedure Laterality Date  . CLOSED REDUCTION ELBOW DISLOCATION    . miscarriage      There were no vitals filed for this visit.       Subjective Assessment - 04/01/17 1145    Subjective Patient reports she is here today to be seen by her medical team for her newly diagnosed right breast cancer.   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 It is ER/PR positive and HER2 negative with a Ki67 of 5%. It measures 9 mm and is located in the lower inner quadrant. She had a traumatic left elbow dislocation in 2012 which has resulted in permanent flexion contracture and lacks 17 degrees of extension. This appears to make it difficult to get her arm in a position that would allow for radiation but she is able to get there with some discomfort.   Patient Stated Goals Reduce lymphedema risk and learn post op shoulder ROM HEP   Currently in Pain? Yes   Pain Score 5    Pain Location Elbow   Pain Orientation Right   Pain Descriptors / Indicators Aching   Pain Type Chronic pain   Pain Onset More than a month ago   Pain Frequency Intermittent   Aggravating Factors  Overuse of right arm, driving   Pain Relieving Factors  Rest   Multiple Pain Sites No            [facility] PT Assessment - 04/01/17 0001      Assessment   Medical Diagnosis Right breast cancer   Referring Provider Dr. Excell Seltzer   Onset Date/Surgical Date 03/19/17   Hand Dominance Right   Prior Therapy none     Precautions   Precautions Other (comment)   Precaution Comments active cancer     Restrictions   Weight Bearing Restrictions No     Balance Screen   Has the patient fallen in the past 6 months No   Has the patient had a decrease in activity level because of a fear of falling?  No   Is the patient reluctant to leave their home because of a fear of falling?  No     Home Environment   Living Environment Private residence   Living Arrangements Children  73 and 22 y.o. 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; teaches some   Leisure She does not exercise     Cognition   Overall Cognitive Status Within Functional Limits for tasks assessed     Posture/Postural Control  Posture/Postural Control Postural limitations   Postural Limitations Rounded Shoulders;Forward head     ROM / Strength   AROM / PROM / Strength AROM;Strength     AROM   AROM Assessment Site Shoulder;Cervical   Right/Left Shoulder Right;Left   Right Shoulder Extension 51 Degrees   Right Shoulder Flexion 127 Degrees   Right Shoulder ABduction 143 Degrees   Right Shoulder Internal Rotation 57 Degrees   Right Shoulder External Rotation 81 Degrees   Left Shoulder Extension 50 Degrees   Left Shoulder Flexion 144 Degrees   Left Shoulder ABduction 158 Degrees   Left Shoulder Internal Rotation 65 Degrees   Left Shoulder External Rotation 78 Degrees   Cervical Flexion WNL   Cervical Extension WNL   Cervical - Right Side Bend WNL   Cervical - Left Side Bend WNL   Cervical - Right Rotation WNL   Cervical -  Left Rotation WNL     Strength   Overall Strength Within functional limits for tasks performed           LYMPHEDEMA/ONCOLOGY QUESTIONNAIRE - 04/01/17 1158      Type   Cancer Type Right breast cancer     Lymphedema Assessments   Lymphedema Assessments Upper extremities     Right Upper Extremity Lymphedema   10 cm Proximal to Olecranon Process 36.3 cm   Olecranon Process 27.6 cm   10 cm Proximal to Ulnar Styloid Process 26.4 cm   Just Proximal to Ulnar Styloid Process 16.2 cm   Across Hand at [employer] 18 cm   At [place] of 2nd Digit 6 cm     Left Upper Extremity Lymphedema   10 cm Proximal to Olecranon Process 35.2 cm   Olecranon Process 28 cm   10 cm Proximal to Ulnar Styloid Process 25.1 cm   Just Proximal to Ulnar Styloid Process 15.6 cm   Across Hand at [employer] 17.8 cm   At [place] of 2nd Digit 5.5 cm         Objective measurements completed on examination: See above findings.      Patient was instructed today in a home exercise program today for post op shoulder range of motion. These included active assist shoulder flexion in sitting, scapular retraction, wall walking with shoulder abduction, and hands behind head external rotation.  She was encouraged to do these twice a day, holding 3 seconds and repeating 5 times when permitted by her physician.               PT Education - 04/01/17 1159    Education provided Yes   Education Details Lymphedema risk reduction and post op shoulder ROM HEP   Person(s) Educated Patient   Methods Explanation;Demonstration;Handout   Comprehension Returned demonstration;Verbalized understanding              Breast Clinic Goals - 04/01/17 1204      Patient will be able to verbalize understanding of pertinent lymphedema risk reduction practices relevant to her diagnosis specifically related to skin care.   Time 1   Period Days   Status Achieved     Patient will be able to return demonstrate and/or  verbalize understanding of the post-op home exercise program related to regaining shoulder range of motion.   Time 1   Period Days   Status Achieved     Patient will be able to verbalize understanding of the importance of attending the postoperative After Breast Cancer Class for further lymphedema risk reduction education  and therapeutic exercise.   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 It is ER/PR positive and HER2 negative with a Ki67 of 5%. It measures 9 mm and is located in the lower inner quadrant. She had a traumatic left elbow dislocation in 2012 which has resulted in permanent flexion contracture and lacks 17 degrees of extension. This appears to make it difficult to get her arm in a position that would allow for radiation but she is able to get there with some discomfort. Her multidisciplinary medical team met prior to her assessments to determine a recommended treatment plan. She is planning to have genetic testing, a right lumpectomy with a sentinel node biopsy, Oncotype testing, radiation, and anti-estrogen therapy. She may benefit from post op PT to regain her shoulder ROM and reduce her risk of lymphedema.   History and Personal Factors relevant to plan of care: Right elbow previous dislocation with limited ROM which might impact radiation   Clinical Presentation Evolving   Clinical Presentation due to: Patient may choose bilateral mastectomy if genetics come back positive; also awaiting oncotype results on core biopsy which will determine chemotherapy needs; lives with her 2 children and is the only caregiver to her 50 y.o. child.   Clinical Decision Making Moderate   Rehab Potential Excellent   Clinical Impairments Affecting Rehab Potential right elbow dysfunction   PT Frequency One time visit   PT  Treatment/Interventions Patient/family education;Therapeutic exercise   PT Next Visit Plan Will f/u after surgery to determine PT needs   PT Home Exercise Plan Post op shoulder ROM HEP   Consulted and Agree with Plan of Care Patient;Family member/caregiver   Family Member Consulted friend      Patient will benefit from skilled therapeutic intervention in order to improve the following deficits and impairments:  Decreased range of motion, Impaired UE functional use, Pain, Decreased knowledge of precautions, Postural dysfunction  Visit Diagnosis: Carcinoma of lower-inner quadrant of right breast in female, estrogen receptor positive ([place]) - Plan: PT plan of care cert/re-cert  Abnormal posture - Plan: PT plan of care cert/re-cert  Pain in right elbow - Plan: PT plan of care cert/re-cert  Stiffness of right elbow, not elsewhere classified - Plan: PT plan of care cert/re-cert   Patient will follow up at outpatient cancer rehab if needed following surgery.  If the patient requires physical therapy at that time, a specific plan will be dictated and sent to the referring physician for approval. The patient was educated today on appropriate basic range of motion exercises to begin post operatively and the importance of attending the After Breast Cancer class following surgery.  Patient was educated today on lymphedema risk reduction practices as it pertains to recommendations that will benefit the patient immediately following surgery.  She verbalized good understanding.  No additional physical therapy is indicated at this time.      Problem List Patient Active Problem List   Diagnosis Date Noted  . Malignant neoplasm of lower-inner quadrant of right breast of female, estrogen receptor positive ([place]) 03/31/2017    Annia Friendly, PT 04/01/17 12:06 PM  [place], [place], [address] Phone: [phone]   Fax:   [phone]  Name: Jovi Alvizo MRN:  [id number] Date of Birth: [birth year]

## 2017-04-01 NOTE — Patient Instructions (Signed)

## 2017-04-01 NOTE — Progress Notes (Signed)
REFERRING PROVIDER: Chauncey Cruel, MD [place], [place] [address]  PRIMARY PROVIDER:  Scifres, Dorothy, PA-C  PRIMARY REASON FOR VISIT:  1. Family history of breast cancer   2. Malignant neoplasm of lower-inner quadrant of right breast of female, estrogen receptor positive ([place])      HISTORY OF PRESENT ILLNESS:   Ruth Nguyen, a 46 y.o. female, was seen for a [place] cancer genetics consultation at the request of Dr. Jana Hakim due to a personal and family history of cancer.  Ruth Nguyen presents to clinic today to discuss the possibility of a hereditary predisposition to cancer, genetic testing, and to further clarify her future cancer risks, as well as potential cancer risks for family members.   In June 2018, at the age of 21, Ruth Nguyen was diagnosed with ER+/PR+, HER2- Invasive ductal Carcinoma of the  of the right breast. She was seen in the breast multidisciplinary clinic on April 01, 2017  to discuss her treatment plan.    HORMONAL RISK FACTORS:  Menarche was at age 46-13.  First live birth at age 22.  OCP use for approximately 1 year.  Ovaries intact: yes.  Hysterectomy: no.  Menopausal status: premenopausal.  Colonoscopy: no; not examined. Mammogram within the last year: yes. Number of breast biopsies: 1. Up to date with pelvic exams:  yes.   Past Medical History:  Diagnosis Date  . Anemia   . Anxiety   . Breast cancer ([place])   . Family history of breast cancer 04/01/2017  . Migraines     Past Surgical History:  Procedure Laterality Date  . CLOSED REDUCTION ELBOW DISLOCATION    . miscarriage      Social History   Social History  . Marital status: Single    Spouse name: N/A  . Number of children: N/A  . Years of education: N/A   Social History Main Topics  . Smoking status: Never Smoker  . Smokeless tobacco: None  . Alcohol use No  . Drug use: No  . Sexual activity: No   Other Topics Concern  . None   Social History  Narrative  . None     FAMILY HISTORY:  We obtained a detailed, 4-generation family history.  Significant diagnoses are listed below: Family History  Problem Relation Age of Onset  . Fibromyalgia Mother   . Heart failure Mother   . Mental illness Sister   . ADD / ADHD Daughter   . Asthma Son   . ADD / ADHD Son   . Osteoporosis Maternal Grandmother   . Parkinson's disease Maternal Grandfather   . Breast cancer Paternal Grandmother 39       died in 81's  . Stroke Paternal Grandfather   . Cancer Paternal Grandfather 25       abdominal cancer- stomach?Colon?   Ms. Raybuck has an 72 year-old daughter and 68 year-old son who are both cancer free. The patient has a 43 year-old sister who has no history of cancer and has a 42 year old son.  The patient has a 67 year-old brother who is cancer free, and has 2 daughters ages 30 and 38.  Ms. Wnek mother is 70, and has heart failure.   -The patient has a 70 year-old maternal aunt who has no history of cancer  -This aunt has two sons in their 66's and 53's who have no history of cancer. -The patient has a 16 year old maternal aunt who has no history of cancer.   -  This aunt has a son and daughter in their 52's with no cancer history.  The patient's cousins once removed on this side of the family are all cancer free. The patient's maternal grandmother died in her 62'I due to complications after a fall.  This grandmother had 7 siblings, some of which may have had cancer.  The patient's maternal grandfather died in his 61's due to Alzheimer's, heart issues, and Parkinson's.  This grandfather  Had 6 siblings, none with any known history of cancer.    Mr. Ingber father is 89, and has Afib.  -The patient has 1 paternal uncle who is 32 and has no history of cancer.          -This uncle has a 8 year-old son and a 68 year-old son (who has a daughter). All are cancer free.  -The patient has a paternal aunt who is 34 and has no history of cancer.   This aunt has three daughters in their 78's and 38's with no history of cancer.  The patient's cousins once removed on this side of the family are all cancer free.  The patient's paternal grandmother was diagnosed with breast cancer in her 58's and died in  Her 26's due to a stroke. This grandmother had 3 other siblings with no history of cancer.  The patient's paternal grandfather had some type of abdominal cancer (maybe colon or stomach) in his 27's and died in his 32's.  This grandfather had 98 siblings, but there is limited information about this side of the family.   The patient's Ms. Abt is unaware of previous family history of genetic testing for hereditary cancer risks. Patient's maternal ancestors are of [place] and [place] descent, and paternal ancestors are of [place] and [place] descent. There is No reported Ashkenazi Jewish ancestry. There is No known consanguinity.  GENETIC COUNSELING ASSESSMENT: Ruth Nguyen is a 46 y.o. female with a personal and family history of breast cancer which is somewhat suggestive of a Hereditary cancer syndrome and predisposition to cancer. We, therefore, discussed and recommended the following at today's visit.   DISCUSSION: We reviewed the characteristics, features and inheritance patterns of hereditary cancer syndromes. We also discussed genetic testing, including the appropriate family members to test, the process of testing, insurance coverage and turn-around-time for results. We discussed the implications of a negative, positive and/or variant of uncertain significant result.   We discussed that only 5-10% of cancers are associated with a Hereditary cancer predisposition syndrome.  One of the most common hereditary cancer syndromes that increases breast cancer risk is called Hereditary Breast and Ovarian Cancer (HBOC) syndrome.  This syndrome is caused by mutations in the BRCA1 and BRCA2 genes.  This syndrome increases an individual's lifetime  risk to develop breast, ovarian, pancreatic, and other types of cancer.  There are also many other cancer predisposition syndromes caused by mutations in several other genes.  We discussed that if she is found to have a mutation in one of these genes, it may impact surgical decisions, and alter future medical management recommendations such as increased cancer screenings and consideration of risk reducing surgeries.  A positive result could also have implications for the patient's family members.  A Negative result would mean we were unable to identify a hereditary component to her cancer, but does not rule out the possibility of a hereditary basis for her cancer.  There could be mutations that are undetectable by current technology, or in genes not yet tested or identified to  increase cancer risk.  We discussed the potential to find a Variant of Uncertain Significance or VUS.  These are variants that have not yet been identified as pathogenic or benign, and it is unknown if this variant is associated with increased cancer risk or if this is a normal finding.  Most VUS's are reclassified to benign or likely benign.   It should not be used to make medical management decisions. With time, we suspect the lab will determine the significance of any VUS's identified if any.   In order to get genetic test results in a timely manner so that Ms. Acocella can use these genetic test results for surgical decisions, we recommended Ms. Hammar pursue genetic testing for the [facility] Breast Cancer STAT panel. The STAT Breast cancer panel offered by [facility] includes sequencing and rearrangement analysis for the following 9 genes:  ATM, BRCA1, BRCA2, CDH1, CHEK2, PALB2, PTEN, STK11 and TP53.  If this test is negative, we then recommend Ms. Siddall consider reflex genetic testing to the [facility] Breast Cancer or [facility] Common Hereditary Cancer gene panel.   Based on Ms. Hartstein's personal and family history of cancer, she  meets medical criteria for genetic testing. Despite that she meets criteria, she may still have an out of pocket cost. We discussed that if her out of pocket cost for testing is over $100, the laboratory will call and confirm whether she wants to proceed with testing.  If the out of pocket cost of testing is less than $100 she will be billed by the genetic testing laboratory.   PLAN: After considering the risks, benefits, and limitations, Ms. Korb  provided informed consent to pursue genetic testing and the blood sample was sent to [facility] for analysis of the [facility] Breast Cancer STAT Panel. The STAT Breast cancer panel offered by [facility] includes sequencing and rearrangement analysis for the following 9 genes:  ATM, BRCA1, BRCA2, CDH1, CHEK2, PALB2, PTEN, STK11 and TP53.  Results should be available within approximately 1-2 weeks' time, at which point they will be disclosed by telephone to Ms. Barner, as will any additional recommendations warranted by these results. Ms. Wheelwright will receive a summary of her genetic counseling visit and a copy of her results once available. This information will also be available in Epic. We encouraged Ms. Sabey to remain in contact with cancer genetics annually so that we can continuously update the family history and inform her of any changes in cancer genetics and testing that may be of benefit for her family. Ms. Dome questions were answered to her satisfaction today. Our contact information was provided should additional questions or concerns arise.  Lastly, we encouraged Ms. Arnott to remain in contact with cancer genetics annually so that we can continuously update the family history and inform her of any changes in cancer genetics and testing that may be of benefit for this family.   Ms.  Suares questions were answered to her satisfaction today. Our contact information was provided should additional questions or concerns arise.  Thank you for the referral and allowing [place] to share in the care of your patient.   Tana Felts, MS Genetic Counselor [email or web address] phone: [phone]  The patient was seen for a total of 60 minutes in face-to-face genetic counseling.  This patient was discussed with Drs. Magrinat, Lindi Adie and/or Burr Medico who agrees with the above.

## 2017-04-01 NOTE — Progress Notes (Signed)
Ruth Nguyen  Telephone:([phone]) [phone] Fax:(336) [phone]     ID: [name] DOB: 1970/10/24  MR#: [id number]  VFI#:[id number]  Patient Care Team: Scifres, Durel Salts as PCP - General (Physician Assistant) Everlene Farrier, MD as Consulting Physician (Obstetrics and Gynecology) Excell Seltzer, MD as Consulting Physician (General Surgery) Magrinat, Virgie Dad, MD as Consulting Physician (Oncology) Gery Pray, MD as Consulting Physician (Radiation Oncology) Chauncey Cruel, MD OTHER MD:  CHIEF COMPLAINT: Estrogen receptor positive breast cancer  CURRENT TREATMENT: Tamoxifen; awaiting definitive surgery   BREAST CANCER HISTORY: Lynleigh's college roommate recently died from stage IV breast cancer. This was a major event in [name]'s life and it motivated her to start doing breast self exams. She also noted pain in her left breast and she brought this to medical attention. Dr. Gertie Fey set her up for bilateral diagnostic mammography with tomography and bilateral breast ultrasonography at the [facility] 03/19/2017. This showed the breast density to be category B. In the lower inner quadrant of the right breast there was an area of asymmetry which on ultrasound corresponded to an irregular hypoechoic mass at the 4:00 radiant 7 cm from the nipple measuring 0.9 cm. The right axilla was negative by ultrasonography  The left breast by contrast was negative both by mammography and ultrasonography.   Biopsy of the right breast mass in question 03/23/2017 showed ([id number]) and invasive ductal carcinoma, grade 1, estrogen receptor 95% positive, progesterone receptor 100% positive, both with strong staining intensity, with an MIB-1 of 5%, and no HER-2 amplification, the signals ratio being 1.42 and the number per cell 1.85.  The patient's subsequent history is as detailed below.  INTERVAL HISTORY: [name] was evaluated in the multidisciplinary breast cancer clinic  04/01/2017 accompanied by her friend Stanton Kidney. Her case was also presented in the multidisciplinary breast cancer conference that same morning. At that time a preliminary plan was proposed: Breast conserving surgery with sentinel lymph node sampling, Oncotype, adjuvant radiation, and hormones, with consideration of neoadjuvant hormones given possible delays in the surgery. She also qualify for genetics testing.  REVIEW OF SYSTEMS: Aside from the left breast discomfort, which proved to be a false lead but let the patient to a definitive diagnosis on the contralateral side, there were no specific symptoms leading to the definitive mammogram. The patient denies unusual headaches, visual changes, nausea, vomiting, stiff neck, dizziness, or gait imbalance. There has been no cough, phlegm production, or pleurisy, no chest pain or pressure, and no change in bowel or bladder habits. The patient denies fever, rash, bleeding, unexplained fatigue or unexplained weight loss. A detailed review of systems was otherwise entirely negative.   PAST MEDICAL HISTORY: Past Medical History:  Diagnosis Date  . Anemia   . Anxiety   . Migraines     PAST SURGICAL HISTORY: Past Surgical History:  Procedure Laterality Date  . CLOSED REDUCTION ELBOW DISLOCATION    . miscarriage      FAMILY HISTORY Family History  Problem Relation Age of Onset  . Fibromyalgia Mother   . Mental illness Sister   . ADD / ADHD Daughter   . Asthma Son   . ADD / ADHD Son   . Osteoporosis Maternal Grandmother   . Parkinson's disease Maternal Grandfather   . Breast cancer Paternal Grandmother   . Stroke Paternal Grandfather   . Cancer Paternal Grandfather   The patient's parents are both 27 years old as of June 2018. The patient has one brother, one sister. A paternal grandmother was  diagnosed with breast cancer in her 60s. A paternal grandfather had "stomach" cancer at an older adult. There is no history of ovarian cancer in the  family.  GYNECOLOGIC HISTORY:  Patient's last menstrual period was 03/06/2017 (approximate). Menarche age 62, first live birth age 72, she is [place] P2. She still has regular periods which last 5-7 days of which the first 2 days are heavy. She took oral contraceptives briefly for about a year remotely, stopped because of headaches.  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 She is divorced, at home is just she and her 2 children, [name], studying English at [employer], and eighth greater, as of June 2018. 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    ADVANCED DIRECTIVES: At the 04/01/2017 visit the patient was given the appropriate documents to complete and notarize at her discretion   HEALTH MAINTENANCE: Social History  Substance Use Topics  . Smoking status: Never Smoker  . Smokeless tobacco: Not on file  . Alcohol use No     Colonoscopy:Never  PAP:  Bone density:[facility], 02/14/2013, T score -0.6   Allergies  Allergen Reactions  . Penicillins Itching    Current Outpatient Prescriptions  Medication Sig Dispense Refill  . albuterol (ACCUNEB) 0.63 MG/3ML nebulizer solution Take 1 ampule by nebulization every 6 (six) hours as needed for wheezing.    Marland Kitchen amitriptyline (ELAVIL) 25 MG tablet Take 25 mg by mouth daily.  1  . SUMAtriptan (IMITREX) 25 MG tablet Take 25 mg by mouth every 2 (two) hours as needed.     No current facility-administered medications for this visit.     OBJECTIVE: Middle-aged white woman in no acute distress  Vitals:   04/01/17 0848  BP: 127/74  Pulse: 92  Resp: 19  Temp: 98.3 F (36.8 C)     Body mass index is 38.01 kg/m.   Filed Weights   04/01/17 0848  Weight: 223 lb 3.2 oz (101.2 kg)       ECOG FS:0 - Asymptomatic  Ocular: Sclerae unicteric, pupils round and equal Ear-nose-throat: Oropharynx clear and moist Lymphatic:  No cervical or supraclavicular adenopathy Lungs no rales or rhonchi Heart regular rate and rhythm Abd soft, nontender, positive bowel sounds MSK no focal spinal tenderness, no joint edema Neuro: non-focal, well-oriented, appropriate affect Breasts: I do not palpate a well-defined mass in the right breast. There are no skin or nipple changes of concern. The left breast is unremarkable. Both axillae are benign.   LAB RESULTS:  CMP     Component Value Date/Time   NA 139 04/01/2017 0819   K 4.0 04/01/2017 0819   CO2 27 04/01/2017 0819   GLUCOSE 79 04/01/2017 0819   BUN 9.3 04/01/2017 0819   CREATININE 0.7 04/01/2017 0819   CALCIUM 9.2 04/01/2017 0819   PROT 7.6 04/01/2017 0819   ALBUMIN 3.5 04/01/2017 0819   AST 17 04/01/2017 0819   ALT 19 04/01/2017 0819   ALKPHOS 87 04/01/2017 0819   BILITOT 0.39 04/01/2017 0819    No results found for: TOTALPROTELP, ALBUMINELP, A1GS, A2GS, BETS, BETA2SER, GAMS, MSPIKE, SPEI  No results found for: KPAFRELGTCHN, LAMBDASER, KAPLAMBRATIO  Lab Results  Component Value Date   WBC 5.8 04/01/2017   NEUTROABS 3.4 04/01/2017   HGB 13.2 04/01/2017   HCT 40.2 04/01/2017   MCV 90.1 04/01/2017   PLT 328 04/01/2017      Chemistry  Component Value Date/Time   NA 139 04/01/2017 0819   K 4.0 04/01/2017 0819   CO2 27 04/01/2017 0819   BUN 9.3 04/01/2017 0819   CREATININE 0.7 04/01/2017 0819      Component Value Date/Time   CALCIUM 9.2 04/01/2017 0819   ALKPHOS 87 04/01/2017 0819   AST 17 04/01/2017 0819   ALT 19 04/01/2017 0819   BILITOT 0.39 04/01/2017 0819       No results found for: LABCA2  No components found for: [id number]  No results for input(s): INR in the last 168 hours.  Urinalysis No results found for: COLORURINE, APPEARANCEUR, LABSPEC, PHURINE, GLUCOSEU, HGBUR, BILIRUBINUR, KETONESUR, PROTEINUR, UROBILINOGEN, NITRITE, LEUKOCYTESUR   STUDIES: US Breast Ltd Uni Left Inc Axilla  Result Date: 03/19/2017 CLINICAL DATA:   Patient describes focal pain within the inner left breast at the 10 o'clock axis for 1 month. EXAM: 2D DIGITAL DIAGNOSTIC BILATERAL MAMMOGRAM WITH CAD AND ADJUNCT TOMO ULTRASOUND BILATERAL BREAST COMPARISON:  Previous exam(s). ACR Breast Density Category b: There are scattered areas of fibroglandular density. FINDINGS: Bilateral 2D CC and MLO projections were obtained today, with additional 3D tomosynthesis, and with additional spot compression view of the inner left breast corresponding to the area of clinical concern, with overlying skin marker in place. Right breast: There is an asymmetry within the lower inner quadrant of the right breast, with possible associated distortion, persistent with spot compression and rolled views, tomosynthesis MLO slice 47, tomosynthesis CC slice 69, tomosynthesis spot compression CC slice 56. Left breast: There are no dominant masses, suspicious calcifications or secondary signs of malignancy within the left breast. Specifically, there is no mammographic abnormality within the inner left breast corresponding to the area of breast pain. Mammographic images were processed with CAD. Right breast: Targeted ultrasound is performed, evaluating the lower inner quadrant of the right breast corresponding to the mammographic finding, showing an irregular hypoechoic mass at the 4 o'clock axis, 7 cm from the nipple, measuring 9 x 4 x 6 mm, avascular, a likely correlate for the mammographic finding. Left breast: Next, targeted ultrasound is performed of the upper inner quadrant of the left breast, with particular attention to the 10 o'clock axis as directed by the patient, showing only normal fibroglandular tissues and fat lobules. No suspicious solid or cystic mass is identified. Right axilla was evaluated with ultrasound showing no enlarged or morphologically abnormal lymph nodes. IMPRESSION: 1. Irregular hypoechoic mass within the right breast at the 4 o'clock axis, 7 cm from the nipple,  measuring 9 mm, a likely correlate for the asymmetry with possible associated distortion seen on mammogram. This is a suspicious finding for which ultrasound-guided biopsy is recommended. 2. No evidence of malignancy within the left breast. RECOMMENDATION: 1. Ultrasound-guided biopsy of the RIGHT breast mass at the 4 o'clock axis. 2. Postprocedure mammogram to ensure sonographic and mammographic correspondence. Ultrasound-guided biopsy is scheduled for June 11th. I have discussed the findings and recommendations with the patient. Results were also provided in writing at the conclusion of the visit. If applicable, a reminder letter will be sent to the patient regarding the next appointment. BI-RADS CATEGORY  4: Suspicious. Electronically Signed   By: Franki Cabot M.D.   On: 03/19/2017 11:21   US Breast Ltd Uni Right Inc Axilla  Result Date: 03/19/2017 CLINICAL DATA:  Patient describes focal pain within the inner left breast at the 10 o'clock axis for 1 month. EXAM: 2D DIGITAL DIAGNOSTIC BILATERAL MAMMOGRAM WITH CAD AND ADJUNCT TOMO ULTRASOUND BILATERAL BREAST  COMPARISON:  Previous exam(s). ACR Breast Density Category b: There are scattered areas of fibroglandular density. FINDINGS: Bilateral 2D CC and MLO projections were obtained today, with additional 3D tomosynthesis, and with additional spot compression view of the inner left breast corresponding to the area of clinical concern, with overlying skin marker in place. Right breast: There is an asymmetry within the lower inner quadrant of the right breast, with possible associated distortion, persistent with spot compression and rolled views, tomosynthesis MLO slice 47, tomosynthesis CC slice 69, tomosynthesis spot compression CC slice 56. Left breast: There are no dominant masses, suspicious calcifications or secondary signs of malignancy within the left breast. Specifically, there is no mammographic abnormality within the inner left breast corresponding to the  area of breast pain. Mammographic images were processed with CAD. Right breast: Targeted ultrasound is performed, evaluating the lower inner quadrant of the right breast corresponding to the mammographic finding, showing an irregular hypoechoic mass at the 4 o'clock axis, 7 cm from the nipple, measuring 9 x 4 x 6 mm, avascular, a likely correlate for the mammographic finding. Left breast: Next, targeted ultrasound is performed of the upper inner quadrant of the left breast, with particular attention to the 10 o'clock axis as directed by the patient, showing only normal fibroglandular tissues and fat lobules. No suspicious solid or cystic mass is identified. Right axilla was evaluated with ultrasound showing no enlarged or morphologically abnormal lymph nodes. IMPRESSION: 1. Irregular hypoechoic mass within the right breast at the 4 o'clock axis, 7 cm from the nipple, measuring 9 mm, a likely correlate for the asymmetry with possible associated distortion seen on mammogram. This is a suspicious finding for which ultrasound-guided biopsy is recommended. 2. No evidence of malignancy within the left breast. RECOMMENDATION: 1. Ultrasound-guided biopsy of the RIGHT breast mass at the 4 o'clock axis. 2. Postprocedure mammogram to ensure sonographic and mammographic correspondence. Ultrasound-guided biopsy is scheduled for June 11th. I have discussed the findings and recommendations with the patient. Results were also provided in writing at the conclusion of the visit. If applicable, a reminder letter will be sent to the patient regarding the next appointment. BI-RADS CATEGORY  4: Suspicious. Electronically Signed   By: Franki Cabot M.D.   On: 03/19/2017 11:21   Mm Diag Breast Tomo Bilateral  Result Date: 03/19/2017 CLINICAL DATA:  Patient describes focal pain within the inner left breast at the 10 o'clock axis for 1 month. EXAM: 2D DIGITAL DIAGNOSTIC BILATERAL MAMMOGRAM WITH CAD AND ADJUNCT TOMO ULTRASOUND BILATERAL  BREAST COMPARISON:  Previous exam(s). ACR Breast Density Category b: There are scattered areas of fibroglandular density. FINDINGS: Bilateral 2D CC and MLO projections were obtained today, with additional 3D tomosynthesis, and with additional spot compression view of the inner left breast corresponding to the area of clinical concern, with overlying skin marker in place. Right breast: There is an asymmetry within the lower inner quadrant of the right breast, with possible associated distortion, persistent with spot compression and rolled views, tomosynthesis MLO slice 47, tomosynthesis CC slice 69, tomosynthesis spot compression CC slice 56. Left breast: There are no dominant masses, suspicious calcifications or secondary signs of malignancy within the left breast. Specifically, there is no mammographic abnormality within the inner left breast corresponding to the area of breast pain. Mammographic images were processed with CAD. Right breast: Targeted ultrasound is performed, evaluating the lower inner quadrant of the right breast corresponding to the mammographic finding, showing an irregular hypoechoic mass at the 4 o'clock axis, 7  cm from the nipple, measuring 9 x 4 x 6 mm, avascular, a likely correlate for the mammographic finding. Left breast: Next, targeted ultrasound is performed of the upper inner quadrant of the left breast, with particular attention to the 10 o'clock axis as directed by the patient, showing only normal fibroglandular tissues and fat lobules. No suspicious solid or cystic mass is identified. Right axilla was evaluated with ultrasound showing no enlarged or morphologically abnormal lymph nodes. IMPRESSION: 1. Irregular hypoechoic mass within the right breast at the 4 o'clock axis, 7 cm from the nipple, measuring 9 mm, a likely correlate for the asymmetry with possible associated distortion seen on mammogram. This is a suspicious finding for which ultrasound-guided biopsy is recommended. 2.  No evidence of malignancy within the left breast. RECOMMENDATION: 1. Ultrasound-guided biopsy of the RIGHT breast mass at the 4 o'clock axis. 2. Postprocedure mammogram to ensure sonographic and mammographic correspondence. Ultrasound-guided biopsy is scheduled for June 11th. I have discussed the findings and recommendations with the patient. Results were also provided in writing at the conclusion of the visit. If applicable, a reminder letter will be sent to the patient regarding the next appointment. BI-RADS CATEGORY  4: Suspicious. Electronically Signed   By: Franki Cabot M.D.   On: 03/19/2017 11:21   Mm Clip Placement Right  Result Date: 03/23/2017 CLINICAL DATA:  Status post ultrasound-guided core biopsy of a right breast mass. EXAM: DIAGNOSTIC RIGHT MAMMOGRAM POST ULTRASOUND BIOPSY COMPARISON:  Previous exam(s). FINDINGS: Mammographic images were obtained following ultrasound guided biopsy of a mass in the lower inner quadrant of the right breast. Mammographic images show there is a ribbon shaped clip in the lower inner quadrant of the right breast. IMPRESSION: Status post ultrasound-guided core biopsy of the right breast with pathology pending. Final Assessment: Post Procedure Mammograms for Marker Placement Electronically Signed   By: Lillia Mountain M.D.   On: 03/23/2017 16:54   [place] Rt Breast Bx W Loc Dev 1st Lesion Img Bx Spec US Guide  Addendum Date: 03/25/2017   ADDENDUM REPORT: 03/24/2017 13:58 ADDENDUM: Pathology revealed grade I invasive ductal carcinoma in the RIGHT breast. This was found to be concordant by Dr. Lillia Mountain. Pathology results were discussed with the patient by telephone. The patient reported doing well after the biopsy with tenderness at the site. Post biopsy instructions and care were reviewed and questions were answered. The patient was encouraged to call [facility] for any additional concerns. The patient was referred to the [facility] at the [facility] on April 01, 2017. Pathology results reported by Susa Raring RN, BSN on 03/24/2017. Electronically Signed   By: Lillia Mountain M.D.   On: 03/24/2017 13:58   Result Date: 03/25/2017 CLINICAL DATA:  Right breast mass. EXAM: ULTRASOUND GUIDED RIGHT BREAST CORE NEEDLE BIOPSY COMPARISON:  Previous exam(s). PROCEDURE: I met with the patient and we discussed the procedure of ultrasound-guided biopsy, including benefits and alternatives. We discussed the high likelihood of a successful procedure. We discussed the risks of the procedure including infection, bleeding, tissue injury, clip migration, and inadequate sampling. Informed written consent was given. The usual time-out protocol was performed immediately prior to the procedure. Lesion quadrant: Lower-inner quadrant Using sterile technique and 1% Lidocaine as local anesthetic, under direct ultrasound visualization, a 12 gauge vacuum-assisted device was used to perform biopsy of a mass in the 4 o'clock region of the right breastusing a lateral to medial approach. At the  conclusion of the procedure, a ribbon shaped tissue marker clip was deployed into the biopsy cavity. Follow-up 2-view mammogram was performed and dictated separately. IMPRESSION: Ultrasound-guided biopsy of the right breast. No apparent complications. Electronically Signed: By: Lillia Mountain M.D. On: 03/23/2017 16:51    ELIGIBLE FOR AVAILABLE RESEARCH PROTOCOL: No  ASSESSMENT: 46 y.o. [place] woman status post right breast lower outer quadrant biopsy 03/23/2017 for a clinical T1b N0, stage IA invasive ductal carcinoma, grade 1, estrogen and progesterone receptor positive, HER-2 nonamplified, with an MIB-1 of 5%  (1) genetics testing pending  (2) Oncotype DX score to be obtained from the original biopsy  (3) breast conserving surgery with sentinel lymph node proposed, depending on genetics results  (4) Adjuvant radiation to follow  (5)  tamoxifen started neoadjuvantly 04/01/2017 given possibility of some surgical delays  PLAN: We spent the better part of today's hour-long appointment discussing the biology of breast cancer in general, and the specifics of the patient's tumor in particular. We first reviewed the fact that cancer is not one disease but more than 100 different diseases and that it is important to keep them separate-- otherwise when friends and relatives discuss their own cancer experiences with Alie confusion can result. Similarly we explained that if breast cancer spreads to the bone or liver, the patient would not have bone cancer or liver cancer, but breast cancer in the bone and breast cancer in the liver: one cancer in three places-- not 3 different cancers which otherwise would have to be treated in 3 different ways.  We discussed the difference between local and systemic therapy. In terms of loco-regional treatment, lumpectomy plus radiation is equivalent to mastectomy as far as survival is concerned. For this reason, and because the cosmetic results are generally superior, we recommend breast conserving surgery.   We also noted that in terms of sequencing of treatments, whether systemic therapy or surgery is done first does not affect the ultimate outcome. This is important in Imaya's case because there may be some surgical delay pending results of her genetics testing and starting anti-estrogens neoadjuvantly may allow her  We then discussed the rationale for systemic therapy. There is some risk that this cancer may have already spread to other parts of her body. Patients frequently ask at this point about bone scans, CAT scans and PET scans to find out if they have occult breast cancer somewhere else. The problem is that in early stage disease we are much more likely to find false positives then true cancers and this would expose the patient to unnecessary procedures as well as unnecessary radiation. Scans  cannot answer the question the patient really would like to know, which is whether she has microscopic disease elsewhere in her body. For those reasons we do not recommend them.  Of course we would proceed to aggressive evaluation of any symptoms that might suggest metastatic disease, but that is not the case here.  Next we went over the options for systemic therapy which are anti-estrogens, anti-HER-2 immunotherapy, and chemotherapy. Jamiesha does not meet criteria for anti-HER-2 immunotherapy. She is a good candidate for anti-estrogens.  The question of chemotherapy is more complicated. Chemotherapy is most effective in rapidly growing, aggressive tumors. It is much less effective in low-grade, slow growing cancers, like Tamieka 's. For that reason we are going to request an Oncotype from the definitive surgical sample, as suggested by NCCN guidelines. That will help [place] make a definitive decision regarding chemotherapy in this case.  Finally  we discussed the genetics question. In patients who carry a deleterious mutation [for example in a  BRCA gene], the risk of a new breast cancer developing in the future may be sufficiently great that the patient may choose bilateral mastectomies. However if she wishes to keep her breasts in that situation it is safe to do so. That would require intensified screening, which generally means not only yearly mammography but a yearly breast MRI as well. Of course, if there is a deleterious mutation bilateral oophorectomy would be necessary as there is no standard screening protocol for ovarian cancer.  We are starting tamoxifen now as discussed. She has a good understanding of the possible toxicities, side effects and complications of this agent. She will let us know if she has any unusual problems from it.  Jeriah has a good understanding of the overall plan. She agrees with it. She knows the goal of treatment in her case is cure. She will call with any problems  that may develop before her next visit here.  Chauncey Cruel, MD   04/01/2017 9:56 AM Medical Oncology and Hematology [facility] [address] [place], [place] [address] Tel. [phone]    Fax. [phone]

## 2017-04-01 NOTE — Telephone Encounter (Signed)
Received order for oncotype testing on core bx. Requisition sent to [facility]. PAC fax to [facility]

## 2017-04-06 DIAGNOSIS — Z1379 Encounter for other screening for genetic and chromosomal anomalies: Secondary | ICD-10-CM | POA: Insufficient documentation

## 2017-04-07 ENCOUNTER — Telehealth: Payer: Self-pay | Admitting: *Deleted

## 2017-04-07 ENCOUNTER — Telehealth: Payer: Self-pay | Admitting: Genetics

## 2017-04-07 ENCOUNTER — Encounter: Payer: Self-pay | Admitting: Genetics

## 2017-04-07 ENCOUNTER — Other Ambulatory Visit: Payer: Self-pay | Admitting: General Surgery

## 2017-04-07 DIAGNOSIS — C50311 Malignant neoplasm of lower-inner quadrant of right female breast: Secondary | ICD-10-CM

## 2017-04-07 DIAGNOSIS — Z170 Estrogen receptor positive status [ER+]: Principal | ICD-10-CM

## 2017-04-07 NOTE — Telephone Encounter (Signed)
  Oncology Nurse Navigator Documentation  Navigator Location: [facility] (04/07/17 1100)   )Navigator Encounter Type: Telephone (04/07/17 1100) Telephone: [facility] Call;Clinic/[facility] Follow-up (04/07/17 1100)                       Barriers/Navigation Needs: No Questions;No Needs (04/07/17 1100)                          Time Spent with Patient: 15 (04/07/17 1100)

## 2017-04-07 NOTE — Telephone Encounter (Addendum)
Revealed negative genetic testing for the 9 genes tested through the [facility] STAT Breast Cancer Panel.  The STAT Breast cancer panel offered by [facility] includes sequencing and rearrangement analysis for the following 9 genes:  ATM, BRCA1, BRCA2, CDH1, CHEK2, PALB2, PTEN, STK11 and TP53.  Discussed that based on this testing we do not know why she has breast cancer or why there is cancer in the family.  She is negative for these high risk breast cancer genes, and therefore there is no indication at this time to alter surgical decisions for treatment based on the genetic test results.    We also discussed the option to reflex to the [facility] Common Hereditary Cancer Gene Panel (46 genes).  The Hereditary Gene Panel offered by [facility] includes sequencing and/or deletion duplication testing of the following 46 genes: APC, ATM, AXIN2, BARD1, BMPR1A, BRCA1, BRCA2, BRIP1, CDH1, CDKN2A (p14ARF), CDKN2A (p16INK4a), CHEK2, CTNNA1, DICER1, EPCAM (Deletion/duplication testing only), GREM1 (promoter region deletion/duplication testing only), KIT, MEN1, MLH1, MSH2, MSH3, MSH6, MUTYH, NBN, NF1, NHTL1, PALB2, PDGFRA, PMS2, POLD1, POLE, PTEN, RAD50, RAD51C, RAD51D, SDHB, SDHC, SDHD, SMAD4, SMARCA4. STK11, TP53, TSC1, TSC2, and VHL.  The following genes were evaluated for sequence changes only: SDHA and HOXB13 c.251G>A variant only.  She elected and consented to have this reflex testing done.   We will call her with the results from this test report when they are ready.    A total of 10 min was spent talking with patient.

## 2017-04-08 ENCOUNTER — Encounter: Payer: Self-pay | Admitting: Radiation Oncology

## 2017-04-08 ENCOUNTER — Encounter (HOSPITAL_COMMUNITY): Payer: Self-pay

## 2017-04-08 ENCOUNTER — Telehealth: Payer: Self-pay | Admitting: *Deleted

## 2017-04-08 ENCOUNTER — Telehealth: Payer: Self-pay | Admitting: Genetics

## 2017-04-08 NOTE — Telephone Encounter (Signed)
Revealed negative genetic testing.  We do not know why she has breast cancer or why there is cancer in the family. It could be due to a different gene that we are not testing, or maybe our current technology may not be able to pick something up.  We discussed that these results are reassuring and we would not make any additional medical management recommendations based on these results at this time.  It will be important for her to keep in contact with genetics to keep up with whether additional testing may be needed.

## 2017-04-08 NOTE — Telephone Encounter (Signed)
Received oncotype score of 14/9%. Physician team notified. Called pt with results. Discussed next steps. Denies further needs at this time. Referral placed for pt to see Dr. Sondra Come 2 wks after surgery.

## 2017-04-09 ENCOUNTER — Encounter: Payer: Self-pay | Admitting: Genetics

## 2017-04-09 ENCOUNTER — Ambulatory Visit: Payer: Self-pay | Admitting: Genetics

## 2017-04-09 NOTE — Progress Notes (Signed)
HPI: Ms. Sime was previously seen in the [place] [facility] on 04/01/17 due to a personal and family history of cancer and concerns regarding a hereditary predisposition to cancer. Please refer to our prior cancer genetics clinic note for more information regarding Ms. Woolverton's medical, social and family histories, and our assessment and recommendations, at the time. Ms. Horine recent genetic test results were disclosed to her, as were recommendations warranted by these results. These results and recommendations are discussed in more detail below.  CANCER HISTORY:  Ms. Sidell was diagnosed with Breast cancer at the age of 68.    Malignant neoplasm of lower-inner quadrant of right breast of female, estrogen receptor positive ([place])   03/31/2017 Initial Diagnosis    Malignant neoplasm of lower-inner quadrant of right breast of female, estrogen receptor positive ([place])     04/06/2017 Genetic Testing    Patient had genetic testing done for a personal and family history of breast cancer.  The Hereditary Breast Cancer STAT panel was sent out.  The STAT Breast cancer panel offered by [facility] includes sequencing and rearrangement analysis for the following 9 genes:  ATM, BRCA1, BRCA2, CDH1, CHEK2, PALB2, PTEN, STK11 and TP53. The date of this test report is April 06, 2017.  Reflex genetic testing was requested and the updated report date is April 07, 2017.  Reflex testing was order for the Common Hereditary Cancer Panel from [facility].  The Hereditary Gene Panel offered by [facility] includes sequencing and/or deletion duplication testing of the following 46 genes: APC, ATM, AXIN2, BARD1, BMPR1A, BRCA1, BRCA2, BRIP1, CDH1, CDKN2A (p14ARF), CDKN2A (p16INK4a), CHEK2, CTNNA1, DICER1, EPCAM (Deletion/duplication testing only), GREM1 (promoter region deletion/duplication testing only), KIT, MEN1, MLH1, MSH2, MSH3, MSH6, MUTYH, NBN, NF1, NHTL1, PALB2, PDGFRA, PMS2, POLD1, POLE, PTEN, RAD50, RAD51C,  RAD51D, SDHB, SDHC, SDHD, SMAD4, SMARCA4. STK11, TP53, TSC1, TSC2, and VHL.  The following genes were evaluated for sequence changes only: SDHA and HOXB13 c.251G>A variant only.  Results revealed patient has the following mutation(s): Negative- No pathogenic mutations or VUS's were identified in any of the 46 genes analyzed on the Common Hereditary Cancer Panel.        FAMILY HISTORY:  We obtained a detailed, 4-generation family history.  Significant diagnoses are listed below: Family History  Problem Relation Age of Onset  . Fibromyalgia Mother   . Heart failure Mother   . Mental illness Sister   . ADD / ADHD Daughter   . Asthma Son   . ADD / ADHD Son   . Osteoporosis Maternal Grandmother   . Parkinson's disease Maternal Grandfather   . Breast cancer Paternal Grandmother 50       died in 13's  . Stroke Paternal Grandfather   . Cancer Paternal Grandfather 8       abdominal cancer- stomach?Colon?    Ms. Washburn has an 27 year-old daughter and 69 year-old son who are both cancer free. The patient has a 76 year-old sister who has no history of cancer and has a 49 year old son.  The patient has a 38 year-old brother who is cancer free, and has 2 daughters ages 65 and 56.  Ms. Koral mother is 10, and has heart failure.   -The patient has a 15 year-old maternal aunt who has no history of cancer             -This aunt has two sons in their 44's and 38's who have no history of cancer. -The patient has a 36  year old maternal aunt who has no history of cancer.              -This aunt has a son and daughter in their 50's with no cancer history.  The patient's cousins once removed on this side of the family are all cancer free. The patient's maternal grandmother died in her 41'D due to complications after a fall.  This grandmother had 7 siblings, some of which may have had cancer.  The patient's maternal grandfather died in his 56's due to Alzheimer's, heart issues, and Parkinson's.  This  grandfather  Had 6 siblings, none with any known history of cancer.    Mr. Sardinha father is 4, and has Afib.  -The patient has 1 paternal uncle who is 61 and has no history of cancer.          -This uncle has a 76 year-old son and a 48 year-old son (who has a daughter). All are cancer free.  -The patient has a paternal aunt who is 55 and has no history of cancer.  This aunt has three daughters in their 74's and 56's with no history of cancer.  The patient's cousins once removed on this side of the family are all cancer free.  The patient's paternal grandmother was diagnosed with breast cancer in her 22's and died in  Her 38's due to a stroke. This grandmother had 3 other siblings with no history of cancer.  The patient's paternal grandfather had some type of abdominal cancer (maybe colon or stomach) in his 67's and died in his 97's.  This grandfather had 16 siblings, but there is limited information about this side of the family.   Ms. Hizer is unaware of previous family history of genetic testing for hereditary cancer risks. Patient's maternal ancestors are of [place] and [place] descent, and paternal ancestors are of [place] and [place] descent. There is No reported Ashkenazi Jewish ancestry. There is No known consanguinity.   GENETIC TEST RESULTS: The final reflex genetic testing was reported out on 04/07/2017.  The [facility] Breast Cancer STAT panel was ordered with reflex testing to the Common Hereditary cancer panel  from [facility].   The Hereditary Gene Panel offered by [facility] includes sequencing and/or deletion duplication testing of the following 46 genes: APC, ATM, AXIN2, BARD1, BMPR1A, BRCA1, BRCA2, BRIP1, CDH1, CDKN2A (p14ARF), CDKN2A (p16INK4a), CHEK2, CTNNA1, DICER1, EPCAM (Deletion/duplication testing only), GREM1 (promoter region deletion/duplication testing only), KIT, MEN1, MLH1, MSH2, MSH3, MSH6, MUTYH, NBN, NF1, NHTL1, PALB2, PDGFRA, PMS2, POLD1, POLE, PTEN, RAD50, RAD51C,  RAD51D, SDHB, SDHC, SDHD, SMAD4, SMARCA4. STK11, TP53, TSC1, TSC2, and VHL.  The following genes were evaluated for sequence changes only: SDHA and HOXB13 c.251G>A variant only.  This test was ordered and found no deleterious mutations.  The test report has been scanned into EPIC and is located under the Molecular Pathology section of the Results Review tab.    We discussed with Ms. Marques that since the current genetic testing is not perfect, it is possible there may be a gene mutation in one of these genes that current testing cannot detect, but that chance is small. We also discussed, that it is possible that another gene that has not yet been discovered, or that we have not yet tested, is responsible for the cancer diagnoses in the family, and it is, therefore, important to remain in touch with cancer genetics in the future so that we can continue to offer Ms. Notarianni the most up to date genetic  testing.     ADDITIONAL GENETIC TESTING: We discussed with Ms. Stockburger that there are other genes that are associated with increased cancer risk that can be analyzed. The laboratories that offer such testing look at these additional genes via a hereditary cancer gene panel. Should Ms. Provencal wish to pursue additional genetic testing, we are happy to discuss and coordinate this testing, at any time.    CANCER SCREENING RECOMMENDATIONS:   This result is reassuring, and indicates that Ms. Azzaro likely does not have an increased risk for a future cancer due to a mutation in one of these genes. This normal test also suggests that Ms. Saban's cancer was most likely not due to an inherited predisposition associated with one of these genes.  Most cancers happen by chance and this negative test suggests that her cancer might fall into this category.  We, therefore, recommended she continue to follow the cancer management and screening guidelines provided by her oncology and primary healthcare provider.     RECOMMENDATIONS FOR FAMILY MEMBERS: Women in this family might be at some increased risk of developing cancer, over the general population risk, simply due to the family history of cancer. We recommended women in this family have a yearly mammogram beginning at age 45, or 68 years younger than the earliest onset of cancer, an annual clinical breast exam, and perform monthly breast self-exams. Women in this family should also have a gynecological exam as recommended by their primary provider. All family members should have a colonoscopy by age 78.  FOLLOW-UP: Lastly, we discussed with Ms. Hockenberry that cancer genetics is a rapidly advancing field and it is possible that new genetic tests will be appropriate for her and/or her family members in the future. We encouraged her to remain in contact with cancer genetics on an annual basis so we can update her personal and family histories and let her know of advances in cancer genetics that may benefit this family.   Our contact number was provided. Ms. Angevine questions were answered to her satisfaction, and she knows she is welcome to call us at anytime with additional questions or concerns.   Ferol Luz, MS Genetic Counselor [email or web address]

## 2017-04-10 ENCOUNTER — Encounter (HOSPITAL_BASED_OUTPATIENT_CLINIC_OR_DEPARTMENT_OTHER): Payer: Self-pay | Admitting: *Deleted

## 2017-04-13 ENCOUNTER — Emergency Department (HOSPITAL_COMMUNITY)
Admission: EM | Admit: 2017-04-13 | Discharge: 2017-04-13 | Disposition: A | Discharge status: Home / Self Care | Payer: BC Managed Care – PPO | Attending: Emergency Medicine | Admitting: Emergency Medicine

## 2017-04-13 ENCOUNTER — Encounter (HOSPITAL_COMMUNITY): Payer: Self-pay | Admitting: Emergency Medicine

## 2017-04-13 ENCOUNTER — Emergency Department (HOSPITAL_COMMUNITY): Payer: BC Managed Care – PPO

## 2017-04-13 DIAGNOSIS — R109 Unspecified abdominal pain: Secondary | ICD-10-CM

## 2017-04-13 DIAGNOSIS — M5432 Sciatica, left side: Secondary | ICD-10-CM

## 2017-04-13 DIAGNOSIS — M545 Low back pain: Secondary | ICD-10-CM | POA: Diagnosis not present

## 2017-04-13 DIAGNOSIS — R1084 Generalized abdominal pain: Secondary | ICD-10-CM | POA: Diagnosis not present

## 2017-04-13 DIAGNOSIS — M5431 Sciatica, right side: Secondary | ICD-10-CM

## 2017-04-13 DIAGNOSIS — S39012A Strain of muscle, fascia and tendon of lower back, initial encounter: Secondary | ICD-10-CM

## 2017-04-13 LAB — URINALYSIS, ROUTINE W REFLEX MICROSCOPIC
Bilirubin Urine: NEGATIVE
Glucose, UA: NEGATIVE mg/dL
Ketones, ur: NEGATIVE mg/dL
Leukocytes, UA: NEGATIVE
Nitrite: NEGATIVE
Protein, ur: NEGATIVE mg/dL
Specific Gravity, Urine: 1.002 — ABNORMAL LOW (ref 1.005–1.030)
pH: 6 (ref 5.0–8.0)

## 2017-04-13 LAB — PREGNANCY, URINE: Preg Test, Ur: NEGATIVE

## 2017-04-13 MED ORDER — PREDNISONE 50 MG PO TABS: 50.0000 mg | ORAL_TABLET | Freq: Every day | ORAL | 0 refills | Status: DC

## 2017-04-13 MED ORDER — CYCLOBENZAPRINE HCL 10 MG PO TABS: 10.0000 mg | ORAL_TABLET | Freq: Every day | ORAL | 0 refills | Status: DC

## 2017-04-13 MED ORDER — KETOROLAC TROMETHAMINE 60 MG/2ML IM SOLN
60.0000 mg | Freq: Once | INTRAMUSCULAR | Status: AC
  Administered 2017-04-13: 60 mg via INTRAMUSCULAR
  Filled 2017-04-13: qty 2

## 2017-04-13 MED ORDER — OXYCODONE-ACETAMINOPHEN 5-325 MG PO TABS
1.0000 | ORAL_TABLET | Freq: Once | ORAL | Status: AC
  Administered 2017-04-13: 1 via ORAL
  Filled 2017-04-13: qty 1

## 2017-04-13 MED ORDER — OXYCODONE-ACETAMINOPHEN 5-325 MG PO TABS: 1.0000 | ORAL_TABLET | ORAL | 0 refills | Status: DC | PRN

## 2017-04-13 NOTE — ED Notes (Signed)
ED Provider at bedside. 

## 2017-04-13 NOTE — Discharge Instructions (Signed)
Follow-up with your doctor.  Return here as needed.  Use ice and heat on your lower back.  Her CT scan did not show any signs of kidney stone or destructive bone lesions that would account for your pain

## 2017-04-13 NOTE — ED Provider Notes (Signed)
[facility] DEPT Provider Note   CSN: [id number] Arrival date & time: 04/13/17  7322     History   Chief Complaint Chief Complaint  Patient presents with  . Back Pain    HPI Ruth Nguyen is a 46 y.o. female.  HPI  Patient presents to the emergency department with lower back pain that started Tuesday evening.  The patient states that she did not exercise and yoga on Tuesdaywhich the pain did not seem to respond pain medications at home.  Patient states that movement and palpation make the pain worse.  She states that when she knows Place her back against a hard object like a wall.  It does feel some better.  The patient states she has had some increased frequency of urination.  She started tamoxifen last week as wellThe patient denies chest pain, shortness of breath, headache,blurred vision, neck pain, fever, cough, weakness, numbness, dizziness, anorexia, edema, abdominal pain, nausea, vomiting, diarrhea, rash,dysuria, hematemesis, bloody stool, near syncope, or syncope. Past Medical History:  Diagnosis Date  . Anemia   . Anxiety   . Breast cancer ([place])   . Family history of breast cancer 04/01/2017  . Migraines     Patient Active Problem List   Diagnosis Date Noted  . Genetic testing 04/06/2017  . Family history of breast cancer 04/01/2017  . Malignant neoplasm of lower-inner quadrant of right breast of female, estrogen receptor positive ([place]) 03/31/2017    Past Surgical History:  Procedure Laterality Date  . CLOSED REDUCTION ELBOW DISLOCATION    . DILATION AND CURETTAGE OF UTERUS    . miscarriage    . WISDOM TOOTH EXTRACTION      OB History    No data available       Home Medications    Prior to Admission medications   Medication Sig Start Date End Date Taking? Authorizing Provider  albuterol (ACCUNEB) 0.63 MG/3ML nebulizer solution Take 1 ampule by nebulization every 6 (six) hours as needed for wheezing.    [provider]  amitriptyline  (ELAVIL) 25 MG tablet Take 25 mg by mouth daily. 02/11/17   [provider]  tamoxifen (NOLVADEX) 20 MG tablet Take 1 tablet (20 mg total) by mouth daily. 04/01/17 05/01/17  Magrinat, Virgie Dad, MD    Family History Family History  Problem Relation Age of Onset  . Fibromyalgia Mother   . Heart failure Mother   . Mental illness Sister   . ADD / ADHD Daughter   . Asthma Son   . ADD / ADHD Son   . Osteoporosis Maternal Grandmother   . Parkinson's disease Maternal Grandfather   . Breast cancer Paternal Grandmother 48       died in 70's  . Stroke Paternal Grandfather   . Cancer Paternal Grandfather 63       abdominal cancer- stomach?Colon?    Social History Social History  Substance Use Topics  . Smoking status: Never Smoker  . Smokeless tobacco: Never Used  . Alcohol use No     Allergies   Penicillins   Review of Systems Review of Systems All other systems negative except as documented in the HPI. All pertinent positives and negatives as reviewed in the HPI.  Physical Exam Updated Vital Signs BP 130/87   Pulse 72   Temp 98.1 F (36.7 C) (Oral)   Resp 17   Ht 5\' 5"  (1.651 m)   Wt 101.2 kg (223 lb)   LMP 03/27/2017 (Exact Date)   SpO2 99%  BMI 37.11 kg/m   Physical Exam  Constitutional: She is oriented to person, place, and time. She appears well-developed and well-nourished. No distress.  HENT:  Head: Normocephalic and atraumatic.  Mouth/Throat: Oropharynx is clear and moist.  Eyes: Pupils are equal, round, and reactive to light.  Neck: Normal range of motion. Neck supple.  Cardiovascular: Normal rate, regular rhythm and normal heart sounds.  Exam reveals no gallop and no friction rub.   No murmur heard. Pulmonary/Chest: Effort normal and breath sounds normal. No respiratory distress. She has no wheezes.  Abdominal: Soft. Bowel sounds are normal. She exhibits no distension. There is no tenderness.  Musculoskeletal:       Lumbar back: She exhibits  tenderness, pain and spasm. She exhibits normal range of motion, no bony tenderness, no deformity and normal pulse.  Neurological: She is alert and oriented to person, place, and time. No sensory deficit. She exhibits normal muscle tone. Coordination and gait normal. GCS eye subscore is 4. GCS verbal subscore is 5. GCS motor subscore is 6.  Skin: Skin is warm and dry. Capillary refill takes less than 2 seconds. No rash noted. No erythema.  Psychiatric: She has a normal mood and affect. Her behavior is normal.  Nursing note and vitals reviewed.    ED Treatments / Results  Labs (all labs ordered are listed, but only abnormal results are displayed) Labs Reviewed  URINALYSIS, ROUTINE W REFLEX MICROSCOPIC - Abnormal; Notable for the following:       Result Value   Color, Urine STRAW (*)    Specific Gravity, Urine 1.002 (*)    Hgb urine dipstick SMALL (*)    Bacteria, UA RARE (*)    Squamous Epithelial / LPF 0-5 (*)    All other components within normal limits  PREGNANCY, URINE    EKG  EKG Interpretation None       Radiology Ct Renal Stone Study  Result Date: 04/13/2017 CLINICAL DATA:  Pt complains of lower back pain rad down both legs pain began sat morn 2 am. No h/o kidney stone. EXAM: CT ABDOMEN AND PELVIS WITHOUT CONTRAST TECHNIQUE: Multidetector CT imaging of the abdomen and pelvis was performed following the standard protocol without IV contrast. COMPARISON:  None. FINDINGS: Lower chest: Lung bases are clear. Hepatobiliary: No focal hepatic lesion. No biliary duct dilatation. Gallbladder is normal. Common bile duct is normal. Pancreas: Pancreas is normal. No ductal dilatation. No pancreatic inflammation. Spleen: Normal spleen Adrenals/urinary tract: Adrenal glands and kidneys are normal. The ureters and bladder normal. Stomach/Bowel: Stomach, small-bowel and cecum are normal. The appendix is not identified but there is no pericecal inflammation to suggest appendicitis. The colon and  rectosigmoid colon are normal. Vascular/Lymphatic: Abdominal aorta is normal caliber. There is no retroperitoneal or periportal lymphadenopathy. No pelvic lymphadenopathy. Reproductive: Uterus and ovaries normal. Other: No free fluid. Musculoskeletal: No aggressive osseous lesion. 1 cm sclerotic lesion in the RIGHT iliac wing appears to represent benign chondroid lesion. IMPRESSION: 1. No explanation for flank pain. 2. No ureterolithiasis or obstructive uropathy. Electronically Signed   By: Suzy Bouchard M.D.   On: 04/13/2017 10:08    Procedures Procedures (including critical care time)  Medications Ordered in ED Medications  ketorolac (TORADOL) injection 60 mg (not administered)  oxyCODONE-acetaminophen (PERCOCET/ROXICET) 5-325 MG per tablet 1 tablet (1 tablet Oral Given 04/13/17 0839)     Initial Impression / Assessment and Plan / ED Course  I have reviewed the triage vital signs and the nursing notes.  Pertinent labs &  imaging results that were available during my care of the patient were reviewed by me and considered in my medical decision making (see chart for details).     Patient be treated for musculoskeletal back pain and also advised her to follow-up with her primary doctor and oncologist.  I did advise that at this point there is no aggressive lesions noted in the spine that would be causing her pain.  Her urinalysis did not show any abnormalities.  There is no signs of ureteral stone on CT scan.  We will culture the urine to ensure there is no infectious cause.  Patient does not have any neurological deficits.  She has normal gait and strength in all 4 extremities.  She has normal reflexes  Final Clinical Impressions(s) / ED Diagnoses   Final diagnoses:  Flank pain    New Prescriptions New Prescriptions   No medications on file     Dalia Heading, Hershal Coria 04/13/17 1101    Davonna Belling, MD 04/13/17 1630

## 2017-04-13 NOTE — ED Triage Notes (Signed)
Pt reports back pain after doing some exercise on Tuesday. Pt began having this back pain on early Sunday morning at 0200. Pt reports trying all sorts of home remedies to include ice, heat, and tennis ball therapies with no success. Pt also took some home hydrocodone and got a massage. Pt reports not being able to get relief.

## 2017-04-17 ENCOUNTER — Ambulatory Visit
Admission: RE | Admit: 2017-04-17 | Discharge: 2017-04-17 | Disposition: A | Discharge status: Home / Self Care | Payer: BC Managed Care – PPO | Source: Ambulatory Visit | Attending: General Surgery | Admitting: General Surgery

## 2017-04-17 DIAGNOSIS — Z170 Estrogen receptor positive status [ER+]: Principal | ICD-10-CM

## 2017-04-17 DIAGNOSIS — C50311 Malignant neoplasm of lower-inner quadrant of right female breast: Secondary | ICD-10-CM

## 2017-04-17 NOTE — Progress Notes (Signed)
Pt in to pick up boost breeze, instructions reviewed. 

## 2017-04-20 NOTE — H&P (Signed)
History of Present Illness Ruth Kitchen T. Jerusalem Brownstein Ruth Nguyen; 04/01/2017 12:21 PM) The patient is a 46 year old female who presents with breast cancer. She is a pre menopausal female referred by Dr. Miquel Dunn for evaluation of recently diagnosed carcinoma of the right breast. her college roommate unfortunately recently passed away from metastatic breast cancer. She thinks it was mostly an emotional reaction but she began to experience left breast pain. It was time for her routine mammogram and she presented and due to the pain was referred for a diagnostic workup. Her left breast was negative but there were abnormalities in the right breast as below. Subsequent imaging included diagnostic mamogram showing asymmetry and distortion in the lower inner right breast and ultrasound showing an irregular hypoechoic mass at the 4:00 position 7 cm from the nipple measuring 9 x 4 x 6 mm. axillary ultrasound was negative An ultrasound guided breast biopsy was performed on 03/23/2017 with pathology revealing invasive ductal carcinoma of the breast. She is seen now in breast multidisciplinary clinic for initial treatment planning. She has experienced left breast pain with negative workup but no symptoms in the right breast specifically palpable mass or nipple discharge or skin changes. She does not have a personal history of any previous breast problems.  Findings at that time were the following: Tumor size: 0.9 cm Tumor grade: 1, Ki-67 5% Estrogen Receptor: 96% positive Progesterone Receptor: 00% positive Her-2 neu: negative Lymph node status: negative    Past Surgical History Ruth Pummel, Ruth Nguyen; 04/01/2017 7:35 AM) Breast Biopsy  Right.  Diagnostic Studies History Ruth Pummel, Ruth Nguyen; 04/01/2017 7:35 AM) Colonoscopy  never Mammogram  within last year Pap Smear  1-5 years ago  Medication History Ruth Pummel, Ruth Nguyen; 04/01/2017 7:35 AM) Medications Reconciled  Social History Ruth Pummel, Ruth Nguyen; 04/01/2017 7:35  AM) No alcohol use  No caffeine use  No drug use  Tobacco use  Never smoker.  Family History Ruth Pummel, Ruth Nguyen; 04/01/2017 7:35 AM) Arthritis  Family Members In General, Mother. Breast Cancer  Family Members In General. Cerebrovascular Accident  Family Members In General. Heart Disease  Family Members In General, Father, Mother.  Pregnancy / Birth History Ruth Pummel, Ruth Nguyen; 04/01/2017 7:35 AM) Age at menarche  1 years. Contraceptive History  Oral contraceptives. Gravida  3 Length (months) of breastfeeding  7-12 Maternal age  50-30 Para  2 Regular periods   Other Problems Ruth Pummel, Ruth Nguyen; 04/01/2017 7:35 AM) Back Pain  Lump In Breast  Migraine Headache     Review of Systems Ruth Spillers Ledford Ruth Nguyen; 04/01/2017 7:35 AM) General Present- Fatigue. Not Present- Appetite Loss, Chills, Fever, Night Sweats, Weight Gain and Weight Loss. Skin Not Present- Change in Wart/Mole, Dryness, Hives, Jaundice, New Lesions, Non-Healing Wounds, Rash and Ulcer. HEENT Present- Oral Ulcers and Wears glasses/contact lenses. Not Present- Earache, Hearing Loss, Hoarseness, Nose Bleed, Ringing in the Ears, Seasonal Allergies, Sinus Pain, Sore Throat, Visual Disturbances and Yellow Eyes. Respiratory Not Present- Bloody sputum, Chronic Cough, Difficulty Breathing, Snoring and Wheezing. Breast Present- Breast Mass and Breast Pain. Not Present- Nipple Discharge and Skin Changes. Cardiovascular Not Present- Chest Pain, Difficulty Breathing Lying Down, Leg Cramps, Palpitations, Rapid Heart Rate, Shortness of Breath and Swelling of Extremities. Gastrointestinal Not Present- Abdominal Pain, Bloating, Bloody Stool, Change in Bowel Habits, Chronic diarrhea, Constipation, Difficulty Swallowing, Excessive gas, Gets full quickly at meals, Hemorrhoids, Indigestion, Nausea, Rectal Pain and Vomiting. Female Genitourinary Not Present- Frequency, Nocturia, Painful Urination, Pelvic Pain and  Urgency. Musculoskeletal Present- Back Pain. Not Present- Joint Pain,  Joint Stiffness, Muscle Pain, Muscle Weakness and Swelling of Extremities. Neurological Not Present- Decreased Memory, Fainting, Headaches, Numbness, Seizures, Tingling, Tremor, Trouble walking and Weakness. Endocrine Not Present- Cold Intolerance, Excessive Hunger, Hair Changes, Heat Intolerance, Hot flashes and New Diabetes. Hematology Not Present- Blood Thinners, Easy Bruising, Excessive bleeding, Gland problems, HIV and Persistent Infections.   Physical Exam Ruth Kitchen T. Kaien Pezzullo Ruth Nguyen; 04/01/2017 12:22 PM) The physical exam findings are as follows: Note:General: Alert, well-developed and well nourished Caucasian female, in no distress Skin: Warm and dry without rash or infection. HEENT: No palpable masses or thyromegaly. Sclera nonicteric. Pupils equal round and reactive. Lymph nodes: No cervical, supraclavicular, nodes palpable. breasts: No palpable masses in either breast with particular attention to the biopsy site. No skin changes or nipple crusting or discharge. No palpable axillary adenopathy. Lungs: Breath sounds clear and equal. No wheezing or increased work of breathing. Cardiovascular: Regular rate and rhythm without murmer. No JVD or edema. Abdomen: Nondistended. Soft and nontender. No masses palpable. No organomegaly. Extremities: No edema or joint swelling or deformity. No chronic venous stasis changes. Neurologic: Alert and fully oriented. Gait normal. No focal weakness. Psychiatric: Normal mood and affect. Thought content appropriate with normal judgement and insight    Assessment & Plan Ruth Kitchen T. Aiman Sonn Ruth Nguyen; 04/01/2017 12:27 PM) MALIGNANT NEOPLASM OF LOWER-INNER QUADRANT OF RIGHT BREAST OF FEMALE, ESTROGEN RECEPTOR POSITIVE (C50.311) Impression: 46 year old female with a new diagnosis of cancer of the right breast, lower inner quadrant. Clinical stage 1 a, ER positive, PR positive, HER-2 negative. I  discussed with the patient and her friendtoday initial surgical treatment options. We discussed options of breast conservation with lumpectomy or total mastectomy and sentinal lymph node biopsy/dissection.. After discussion they have elected to proceed with breast conservation with radioactive seed localized lumpectomy and axillary sentinel lymph node biopsy pending results of her genetic testing. We discussed the indications and nature of the procedure, and expected recovery, in detail. Surgical risks including anesthetic complications, cardiorespiratory complications, bleeding, infection, wound healing complications, blood clots, lymphedema, local and distant recurrence and possible need for further surgery based on the final pathology was discussed and understood. Chemotherapy, hormonal therapy and radiation therapy have been discussed. They have been provided with literature regarding the treatment of breast cancer. All questions were answered. They understand and agree to proceed and we will go ahead with scheduling and plan far enough out to allow results of her genetic testing. Current Plans Referred to Genetic Counseling, for evaluation and follow up (Medical Genetics). Routine. radioactive seed localized right breast lumpectomy and right axillary Sentinel lymph node biopsy pending results of genetic testing

## 2017-04-21 ENCOUNTER — Encounter (HOSPITAL_BASED_OUTPATIENT_CLINIC_OR_DEPARTMENT_OTHER): Payer: Self-pay | Admitting: Anesthesiology

## 2017-04-21 ENCOUNTER — Ambulatory Visit (HOSPITAL_BASED_OUTPATIENT_CLINIC_OR_DEPARTMENT_OTHER): Payer: BC Managed Care – PPO | Admitting: Anesthesiology

## 2017-04-21 ENCOUNTER — Ambulatory Visit (HOSPITAL_COMMUNITY)
Admission: RE | Admit: 2017-04-21 | Discharge: 2017-04-21 | Disposition: A | Discharge status: Home / Self Care | Payer: BC Managed Care – PPO | Source: Ambulatory Visit | Attending: General Surgery | Admitting: General Surgery

## 2017-04-21 ENCOUNTER — Ambulatory Visit (HOSPITAL_BASED_OUTPATIENT_CLINIC_OR_DEPARTMENT_OTHER)
Admission: RE | Admit: 2017-04-21 | Discharge: 2017-04-21 | Disposition: A | Discharge status: Home / Self Care | Payer: BC Managed Care – PPO | Source: Ambulatory Visit | Attending: Surgery | Admitting: Surgery

## 2017-04-21 ENCOUNTER — Ambulatory Visit
Admission: RE | Admit: 2017-04-21 | Discharge: 2017-04-21 | Disposition: A | Discharge status: Home / Self Care | Payer: BC Managed Care – PPO | Source: Ambulatory Visit | Attending: General Surgery | Admitting: General Surgery

## 2017-04-21 ENCOUNTER — Encounter (HOSPITAL_BASED_OUTPATIENT_CLINIC_OR_DEPARTMENT_OTHER)
Admission: RE | Disposition: A | Discharge status: Home / Self Care | Payer: Self-pay | Source: Ambulatory Visit | Attending: Surgery

## 2017-04-21 DIAGNOSIS — G43909 Migraine, unspecified, not intractable, without status migrainosus: Secondary | ICD-10-CM | POA: Insufficient documentation

## 2017-04-21 DIAGNOSIS — Z6836 Body mass index (BMI) 36.0-36.9, adult: Secondary | ICD-10-CM | POA: Insufficient documentation

## 2017-04-21 DIAGNOSIS — C50311 Malignant neoplasm of lower-inner quadrant of right female breast: Secondary | ICD-10-CM

## 2017-04-21 DIAGNOSIS — F419 Anxiety disorder, unspecified: Secondary | ICD-10-CM | POA: Insufficient documentation

## 2017-04-21 DIAGNOSIS — Z8261 Family history of arthritis: Secondary | ICD-10-CM | POA: Insufficient documentation

## 2017-04-21 DIAGNOSIS — C50911 Malignant neoplasm of unspecified site of right female breast: Secondary | ICD-10-CM | POA: Diagnosis present

## 2017-04-21 DIAGNOSIS — E669 Obesity, unspecified: Secondary | ICD-10-CM | POA: Diagnosis not present

## 2017-04-21 DIAGNOSIS — Z803 Family history of malignant neoplasm of breast: Secondary | ICD-10-CM | POA: Insufficient documentation

## 2017-04-21 DIAGNOSIS — Z8249 Family history of ischemic heart disease and other diseases of the circulatory system: Secondary | ICD-10-CM | POA: Diagnosis not present

## 2017-04-21 DIAGNOSIS — Z170 Estrogen receptor positive status [ER+]: Secondary | ICD-10-CM | POA: Insufficient documentation

## 2017-04-21 DIAGNOSIS — Z823 Family history of stroke: Secondary | ICD-10-CM | POA: Insufficient documentation

## 2017-04-21 DIAGNOSIS — D0511 Intraductal carcinoma in situ of right breast: Secondary | ICD-10-CM | POA: Insufficient documentation

## 2017-04-21 HISTORY — PX: BREAST LUMPECTOMY WITH RADIOACTIVE SEED AND SENTINEL LYMPH NODE BIOPSY: SHX6550

## 2017-04-21 SURGERY — BREAST LUMPECTOMY WITH RADIOACTIVE SEED AND SENTINEL LYMPH NODE BIOPSY
Anesthesia: Regional | Site: Breast | Laterality: Right

## 2017-04-21 MED ORDER — ONDANSETRON HCL 4 MG/2ML IJ SOLN
INTRAMUSCULAR | Status: AC
  Filled 2017-04-21: qty 2

## 2017-04-21 MED ORDER — GABAPENTIN 300 MG PO CAPS
ORAL_CAPSULE | ORAL | Status: AC
  Filled 2017-04-21: qty 1

## 2017-04-21 MED ORDER — ACETAMINOPHEN 500 MG PO TABS
ORAL_TABLET | ORAL | Status: AC
  Filled 2017-04-21: qty 2

## 2017-04-21 MED ORDER — CELECOXIB 200 MG PO CAPS
ORAL_CAPSULE | ORAL | Status: AC
Start: 2017-04-21 — End: 2017-04-21
  Filled 2017-04-21: qty 2

## 2017-04-21 MED ORDER — LIDOCAINE HCL (CARDIAC) 20 MG/ML IV SOLN
INTRAVENOUS | Status: AC
  Filled 2017-04-21: qty 5

## 2017-04-21 MED ORDER — LIDOCAINE HCL (CARDIAC) 20 MG/ML IV SOLN
INTRAVENOUS | Status: DC | PRN
  Administered 2017-04-21: 30 mg via INTRAVENOUS

## 2017-04-21 MED ORDER — LACTATED RINGERS IV SOLN
INTRAVENOUS | Status: DC
  Administered 2017-04-21 (×4): via INTRAVENOUS

## 2017-04-21 MED ORDER — MIDAZOLAM HCL 2 MG/2ML IJ SOLN
1.0000 mg | INTRAMUSCULAR | Status: DC | PRN
  Administered 2017-04-21: 2 mg via INTRAVENOUS
  Administered 2017-04-21: 1 mg via INTRAVENOUS

## 2017-04-21 MED ORDER — CELECOXIB 400 MG PO CAPS: 400.0000 mg | ORAL_CAPSULE | ORAL | Status: DC

## 2017-04-21 MED ORDER — PROPOFOL 10 MG/ML IV BOLUS
INTRAVENOUS | Status: AC
  Filled 2017-04-21: qty 20

## 2017-04-21 MED ORDER — OXYCODONE HCL 5 MG PO TABS: 5.0000 mg | ORAL_TABLET | Freq: Once | ORAL | Status: DC | PRN

## 2017-04-21 MED ORDER — PROMETHAZINE HCL 25 MG/ML IJ SOLN
INTRAMUSCULAR | Status: AC
  Filled 2017-04-21: qty 1

## 2017-04-21 MED ORDER — MIDAZOLAM HCL 5 MG/5ML IJ SOLN
INTRAMUSCULAR | Status: DC | PRN
  Administered 2017-04-21: 2 mg via INTRAVENOUS

## 2017-04-21 MED ORDER — PROPOFOL 10 MG/ML IV BOLUS
INTRAVENOUS | Status: DC | PRN
  Administered 2017-04-21: 50 mg via INTRAVENOUS
  Administered 2017-04-21: 200 mg via INTRAVENOUS

## 2017-04-21 MED ORDER — BUPIVACAINE-EPINEPHRINE (PF) 0.5% -1:200000 IJ SOLN
INTRAMUSCULAR | Status: DC | PRN
  Administered 2017-04-21: 10 mL

## 2017-04-21 MED ORDER — DEXAMETHASONE SODIUM PHOSPHATE 10 MG/ML IJ SOLN
INTRAMUSCULAR | Status: AC
  Filled 2017-04-21: qty 1

## 2017-04-21 MED ORDER — ACETAMINOPHEN 500 MG PO TABS
1000.0000 mg | ORAL_TABLET | ORAL | Status: AC
  Administered 2017-04-21: 1000 mg via ORAL

## 2017-04-21 MED ORDER — CIPROFLOXACIN IN D5W 400 MG/200ML IV SOLN
INTRAVENOUS | Status: AC
  Filled 2017-04-21: qty 200

## 2017-04-21 MED ORDER — CHLORHEXIDINE GLUCONATE CLOTH 2 % EX PADS: 6.0000 | MEDICATED_PAD | Freq: Once | CUTANEOUS | Status: DC

## 2017-04-21 MED ORDER — FENTANYL CITRATE (PF) 100 MCG/2ML IJ SOLN
INTRAMUSCULAR | Status: AC
  Filled 2017-04-21: qty 2

## 2017-04-21 MED ORDER — HYDROMORPHONE HCL 1 MG/ML IJ SOLN: 0.2500 mg | INTRAMUSCULAR | Status: DC | PRN

## 2017-04-21 MED ORDER — HYDROCODONE-ACETAMINOPHEN 5-325 MG PO TABS: 1.0000 | ORAL_TABLET | ORAL | 0 refills | Status: DC | PRN

## 2017-04-21 MED ORDER — MEPERIDINE HCL 25 MG/ML IJ SOLN: 6.2500 mg | INTRAMUSCULAR | Status: DC | PRN

## 2017-04-21 MED ORDER — TECHNETIUM TC 99M SULFUR COLLOID FILTERED: 1.0000 | Freq: Once | INTRAVENOUS | Status: DC | PRN

## 2017-04-21 MED ORDER — MIDAZOLAM HCL 2 MG/2ML IJ SOLN
INTRAMUSCULAR | Status: AC
Start: 2017-04-21 — End: 2017-04-21
  Filled 2017-04-21: qty 2

## 2017-04-21 MED ORDER — PHENYLEPHRINE 40 MCG/ML (10ML) SYRINGE FOR IV PUSH (FOR BLOOD PRESSURE SUPPORT)
PREFILLED_SYRINGE | INTRAVENOUS | Status: AC
Start: 2017-04-21 — End: 2017-04-21
  Filled 2017-04-21: qty 10

## 2017-04-21 MED ORDER — OXYCODONE HCL 5 MG/5ML PO SOLN: 5.0000 mg | Freq: Once | ORAL | Status: DC | PRN

## 2017-04-21 MED ORDER — GLYCOPYRROLATE 0.2 MG/ML IJ SOLN
INTRAMUSCULAR | Status: DC | PRN
  Administered 2017-04-21: 0.1 mg via INTRAVENOUS

## 2017-04-21 MED ORDER — CIPROFLOXACIN IN D5W 400 MG/200ML IV SOLN
400.0000 mg | INTRAVENOUS | Status: AC
  Administered 2017-04-21 (×2): 400 mg via INTRAVENOUS

## 2017-04-21 MED ORDER — PHENYLEPHRINE HCL 10 MG/ML IJ SOLN
INTRAMUSCULAR | Status: DC | PRN
  Administered 2017-04-21: 80 ug via INTRAVENOUS

## 2017-04-21 MED ORDER — MIDAZOLAM HCL 2 MG/2ML IJ SOLN
INTRAMUSCULAR | Status: AC
  Filled 2017-04-21: qty 2

## 2017-04-21 MED ORDER — BUPIVACAINE-EPINEPHRINE (PF) 0.5% -1:200000 IJ SOLN
INTRAMUSCULAR | Status: DC | PRN
  Administered 2017-04-21: 30 mL via PERINEURAL

## 2017-04-21 MED ORDER — FENTANYL CITRATE (PF) 100 MCG/2ML IJ SOLN
50.0000 ug | INTRAMUSCULAR | Status: AC | PRN
  Administered 2017-04-21 (×3): 50 ug via INTRAVENOUS

## 2017-04-21 MED ORDER — DEXAMETHASONE SODIUM PHOSPHATE 4 MG/ML IJ SOLN
INTRAMUSCULAR | Status: DC | PRN
  Administered 2017-04-21: 10 mg via INTRAVENOUS

## 2017-04-21 MED ORDER — ONDANSETRON HCL 4 MG/2ML IJ SOLN
INTRAMUSCULAR | Status: DC | PRN
  Administered 2017-04-21: 4 mg via INTRAVENOUS

## 2017-04-21 MED ORDER — SCOPOLAMINE 1 MG/3DAYS TD PT72: 1.0000 | MEDICATED_PATCH | Freq: Once | TRANSDERMAL | Status: DC | PRN

## 2017-04-21 MED ORDER — FENTANYL CITRATE (PF) 100 MCG/2ML IJ SOLN
INTRAMUSCULAR | Status: AC
Start: 2017-04-21 — End: 2017-04-21
  Filled 2017-04-21: qty 2

## 2017-04-21 MED ORDER — GABAPENTIN 300 MG PO CAPS
300.0000 mg | ORAL_CAPSULE | ORAL | Status: AC
  Administered 2017-04-21: 300 mg via ORAL

## 2017-04-21 MED ORDER — PROMETHAZINE HCL 25 MG/ML IJ SOLN
6.2500 mg | INTRAMUSCULAR | Status: DC | PRN
  Administered 2017-04-21: 6.25 mg via INTRAVENOUS

## 2017-04-21 MED ORDER — FENTANYL CITRATE (PF) 100 MCG/2ML IJ SOLN
INTRAMUSCULAR | Status: DC | PRN
  Administered 2017-04-21: 100 ug via INTRAVENOUS

## 2017-04-21 SURGICAL SUPPLY — 47 items
BINDER BREAST XLRG (GAUZE/BANDAGES/DRESSINGS) ×3 IMPLANT
BLADE SURG 15 STRL LF DISP TIS (BLADE) ×1 IMPLANT
BLADE SURG 15 STRL SS (BLADE) ×2
CANISTER SUC SOCK COL 7IN (MISCELLANEOUS) IMPLANT
CANISTER SUCT 1200ML W/VALVE (MISCELLANEOUS) IMPLANT
CHLORAPREP W/TINT 26ML (MISCELLANEOUS) ×3 IMPLANT
COVER BACK TABLE 60X90IN (DRAPES) ×3 IMPLANT
COVER MAYO STAND STRL (DRAPES) ×3 IMPLANT
COVER PROBE W GEL 5X96 (DRAPES) ×3 IMPLANT
DECANTER SPIKE VIAL GLASS SM (MISCELLANEOUS) IMPLANT
DERMABOND ADVANCED (GAUZE/BANDAGES/DRESSINGS) ×2
DERMABOND ADVANCED .7 DNX12 (GAUZE/BANDAGES/DRESSINGS) ×1 IMPLANT
DEVICE DUBIN W/COMP PLATE 8390 (MISCELLANEOUS) ×3 IMPLANT
DRAPE LAPAROSCOPIC ABDOMINAL (DRAPES) ×3 IMPLANT
DRAPE UTILITY XL STRL (DRAPES) ×3 IMPLANT
ELECT COATED BLADE 2.86 ST (ELECTRODE) ×3 IMPLANT
ELECT REM PT RETURN 9FT ADLT (ELECTROSURGICAL) ×3
ELECTRODE REM PT RTRN 9FT ADLT (ELECTROSURGICAL) ×1 IMPLANT
GLOVE BIOGEL PI IND STRL 7.0 (GLOVE) ×2 IMPLANT
GLOVE BIOGEL PI IND STRL 8 (GLOVE) ×1 IMPLANT
GLOVE BIOGEL PI INDICATOR 7.0 (GLOVE) ×4
GLOVE BIOGEL PI INDICATOR 8 (GLOVE) ×2
GLOVE ECLIPSE 7.5 STRL STRAW (GLOVE) ×9 IMPLANT
GLOVE SURG SYN 7.5  E (GLOVE) ×2
GLOVE SURG SYN 7.5 E (GLOVE) ×1 IMPLANT
GOWN STRL REUS W/ TWL LRG LVL3 (GOWN DISPOSABLE) ×1 IMPLANT
GOWN STRL REUS W/ TWL XL LVL3 (GOWN DISPOSABLE) ×1 IMPLANT
GOWN STRL REUS W/TWL LRG LVL3 (GOWN DISPOSABLE) ×2
GOWN STRL REUS W/TWL XL LVL3 (GOWN DISPOSABLE) ×2
ILLUMINATOR WAVEGUIDE N/F (MISCELLANEOUS) ×3 IMPLANT
KIT MARKER MARGIN INK (KITS) ×3 IMPLANT
NDL SAFETY ECLIPSE 18X1.5 (NEEDLE) ×1 IMPLANT
NEEDLE HYPO 18GX1.5 SHARP (NEEDLE) ×2
NEEDLE HYPO 25X1 1.5 SAFETY (NEEDLE) ×6 IMPLANT
NS IRRIG 1000ML POUR BTL (IV SOLUTION) IMPLANT
PACK BASIN DAY SURGERY FS (CUSTOM PROCEDURE TRAY) ×3 IMPLANT
PENCIL BUTTON HOLSTER BLD 10FT (ELECTRODE) ×3 IMPLANT
SLEEVE SCD COMPRESS KNEE MED (MISCELLANEOUS) ×3 IMPLANT
SPONGE LAP 4X18 X RAY DECT (DISPOSABLE) ×3 IMPLANT
SUT MON AB 5-0 PS2 18 (SUTURE) ×3 IMPLANT
SUT VICRYL 3-0 CR8 SH (SUTURE) ×3 IMPLANT
SYR CONTROL 10ML LL (SYRINGE) ×6 IMPLANT
TOWEL OR 17X24 6PK STRL BLUE (TOWEL DISPOSABLE) ×3 IMPLANT
TOWEL OR NON WOVEN STRL DISP B (DISPOSABLE) ×3 IMPLANT
TUBE CONNECTING 20'X1/4 (TUBING) ×1
TUBE CONNECTING 20X1/4 (TUBING) ×2 IMPLANT
YANKAUER SUCT BULB TIP NO VENT (SUCTIONS) ×3 IMPLANT

## 2017-04-21 NOTE — Anesthesia Procedure Notes (Signed)
Procedure Name: LMA Insertion Date/Time: 04/21/2017 1:45 PM Performed by: Toula Moos L Pre-anesthesia Checklist: Patient identified, Emergency Drugs available, Suction available, Patient being monitored and Timeout performed Patient Re-evaluated:Patient Re-evaluated prior to inductionOxygen Delivery Method: Circle system utilized Preoxygenation: Pre-oxygenation with 100% oxygen Intubation Type: IV induction Ventilation: Mask ventilation without difficulty LMA: LMA inserted LMA Size: 4.0 Number of attempts: 1 Airway Equipment and Method: Bite block Placement Confirmation: positive ETCO2 Tube secured with: Tape Dental Injury: Teeth and Oropharynx as per pre-operative assessment

## 2017-04-21 NOTE — Anesthesia Preprocedure Evaluation (Addendum)
Anesthesia Evaluation  Patient identified by MRN, date of birth, ID band Patient awake    Reviewed: Allergy & Precautions, NPO status , Patient's Chart, lab work & pertinent test results  Airway Mallampati: II  TM Distance: >3 FB Neck ROM: Full    Dental no notable dental hx.    Pulmonary neg pulmonary ROS,    Pulmonary exam normal breath sounds clear to auscultation       Cardiovascular negative cardio ROS Normal cardiovascular exam Rhythm:Regular Rate:Normal     Neuro/Psych  Headaches, Anxiety negative neurological ROS  negative psych ROS   GI/Hepatic negative GI ROS, Neg liver ROS,   Endo/Other  negative endocrine ROS  Renal/GU negative Renal ROS     Musculoskeletal negative musculoskeletal ROS (+)   Abdominal (+) + obese,   Peds  Hematology negative hematology ROS (+)   Anesthesia Other Findings Breast Cancer  Reproductive/Obstetrics negative OB ROS                            Anesthesia Physical Anesthesia Plan  ASA: II  Anesthesia Plan: General and Regional   Post-op Pain Management: GA combined w/ Regional for post-op pain   Induction: Intravenous  PONV Risk Score and Plan: 3 and Ondansetron, Dexamethasone, Propofol and Midazolam  Airway Management Planned: LMA  Additional Equipment:   Intra-op Plan:   Post-operative Plan: Extubation in OR  Informed Consent: I have reviewed the patients History and Physical, chart, labs and discussed the procedure including the risks, benefits and alternatives for the proposed anesthesia with the patient or authorized representative who has indicated his/her understanding and acceptance.   Dental advisory given  Plan Discussed with: CRNA  Anesthesia Plan Comments:       Anesthesia Quick Evaluation

## 2017-04-21 NOTE — Op Note (Signed)
Preoperative Diagnosis: RIGHT BREAST CANCER  Postoprative Diagnosis: RIGHT BREAST CANCER  Procedure: Procedure(s): BREAST LUMPECTOMY WITH RADIOACTIVE SEED AND DEEP AXILLARY SENTINEL LYMPH NODE BIOPSY   Surgeon: Excell Seltzer T   Assistants: None  Anesthesia:  General LMA anesthesia  Indications: Patient is a 46 year old female with a recent screening detected stage I cancer of the lower inner right breast, invasive ductal ER/PR +0.9 cm. After extensive preoperative workup and discussion detailed elsewhere we have elected proceed with radioactive seed localized right breast lumpectomy and axillary sentinel lymph node biopsy is initial surgical therapy.    Procedure Detail:  Patient had previously undergone accurate placement of a radioactive seed at the tumor in the clip site in the lower inner right breast. In the holding area she underwent injection of 1 mCi of technetium sulfur colloid intradermally around the right nipple and also underwent a pectoral block by anesthesia.  She was taken to the operating room, placed in supine position on the operating table, and laryngeal mask general anesthesia induced. She received preoperative IV antibiotics. PAS were in place.  The right breast and chest wall and axilla and upper arm were widely sterilely prepped and draped. Patient timeout was performed and correct procedure verified.  The lumpectomy was approached initially. The seed was localized in the somewhat extreme lower inner right breast.  I used an incision in the inframammary crease and dissection was carried down into the subcutaneous tissue. Using the lighted retractor as a skin and subcutaneous flap was then raised superiorly overlying the seed and tumor. The area was exposed and using the neoprobe for guidance generous specimen of breast tissue was excised around the seed. This was an approximately 3-1/2-4 cm specimen. This was inked for margins and specimen x-ray showed the seed and  marking clip centrally located within the specimen. This was sent for permanent pathology. Complete hemostasis was assured. The lumpectomy cavity was marked with clips. The breast and subcutaneous tissue was closed with interrupted 3-0 Vicryl. Attention was turned to the sentinel node biopsy. A hot area in the right axilla was localized with a small transverse incision made in a skin crease. Dissection was carried down through the subcutaneous tissue using cautery. The clavipectoral fascia was exposed and incised. Using the neoprobe for guidance I dissected down onto a slightly enlarged but soft deep axillary node with very high counts. This was completely excised with cautery and ex vivo had counts in excess of 6000. This was sent as hot right axillary sentinel lymph node. There was no palpable adenopathy. I extensively examined the axilla with the neoprobe and there were no counts higher than 10. Hemostasis was assured and the deep axillary and subcutaneous tissue closed with interrupted 3-0 Vicryl. The skin incisions were infiltrated with Marcaine. Skin was closed with running septic or 5-0 Monocryl and Dermabond. Sponge needle and instrument counts were correct.    Findings: As above  Estimated Blood Loss:  Minimal         Drains: None  Blood Given: none          Specimens: #1 right breast lumpectomy   #2 hot right axillary sentinel lymph node        Complications:  * No complications entered in OR log *         Disposition: PACU - hemodynamically stable.         Condition: stable

## 2017-04-21 NOTE — Progress Notes (Signed)
Assisted Dr. Germeroth with right, ultrasound guided, pectoralis block. Side rails up, monitors on throughout procedure. See vital signs in flow sheet. Tolerated Procedure well. 

## 2017-04-21 NOTE — Anesthesia Procedure Notes (Signed)
Anesthesia Regional Block: Pectoralis block   Pre-Anesthetic Checklist: ,, timeout performed, Correct Patient, Correct Site, Correct Laterality, Correct Procedure, Correct Position, site marked, Risks and benefits discussed,  Surgical consent,  Pre-op evaluation,  At surgeon's request and post-op pain management  Laterality: Right  Prep: chloraprep       Needles:   Needle Type: Stimiplex     Needle Length: 9cm      Additional Needles:   Procedures: ultrasound guided,,,,,,,,  Narrative:  Start time: 04/21/2017 12:42 PM End time: 04/21/2017 12:48 PM Injection made incrementally with aspirations every 5 mL.  Performed by: Personally  Anesthesiologist: Nolon Nations  Additional Notes: Patient tolerated well. Good fascial spread noted.

## 2017-04-21 NOTE — Anesthesia Postprocedure Evaluation (Signed)
Anesthesia Post Note  Patient: Ruth Nguyen  Procedure(s) Performed: Procedure(s) (LRB): BREAST LUMPECTOMY WITH RADIOACTIVE SEED AND SENTINEL LYMPH NODE BIOPSY (Right)     Patient location during evaluation: PACU Anesthesia Type: General and Regional Level of consciousness: sedated and patient cooperative Pain management: pain level controlled Vital Signs Assessment: post-procedure vital signs reviewed and stable Respiratory status: spontaneous breathing Cardiovascular status: stable Anesthetic complications: no    Last Vitals:  Vitals:   04/21/17 1530 04/21/17 1545  BP: 128/86 111/70  Pulse: 77 67  Resp: 14 15  Temp:      Last Pain:  Vitals:   04/21/17 1545  TempSrc:   PainSc: [place]

## 2017-04-21 NOTE — Transfer of Care (Signed)
Immediate Anesthesia Transfer of Care Note  Patient: Ruth Nguyen  Procedure(s) Performed: Procedure(s): BREAST LUMPECTOMY WITH RADIOACTIVE SEED AND SENTINEL LYMPH NODE BIOPSY (Right)  Patient Location: PACU  Anesthesia Type:GA combined with regional for post-op pain  Level of Consciousness: awake and patient cooperative  Airway & Oxygen Therapy: Patient Spontanous Breathing and Patient connected to face mask oxygen  Post-op Assessment: Report given to RN and Post -op Vital signs reviewed and stable  Post vital signs: Reviewed and stable  Last Vitals:  Vitals:   04/21/17 1310 04/21/17 1315  BP:  (!) 100/53  Pulse: 94 96  Resp: 17 17  Temp:      Last Pain:  Vitals:   04/21/17 1137  TempSrc: Oral         Complications: No apparent anesthesia complications

## 2017-04-21 NOTE — Interval H&P Note (Signed)
History and Physical Interval Note:  04/21/2017 1:34 PM  Ruth Nguyen  has presented today for surgery, with the diagnosis of RIGHT BREAST CANCER  The various methods of treatment have been discussed with the patient and family. After consideration of risks, benefits and other options for treatment, the patient has consented to  Procedure(s): BREAST LUMPECTOMY WITH RADIOACTIVE SEED AND SENTINEL LYMPH NODE BIOPSY (Right) as a surgical intervention .  The patient's history has been reviewed, patient examined, no change in status, stable for surgery.  I have reviewed the patient's chart and labs.  Questions were answered to the patient's satisfaction.     Kenechukwu Eckstein T

## 2017-04-21 NOTE — Discharge Instructions (Signed)
[facility] °Office Phone Number [phone] ° °BREAST BIOPSY/ PARTIAL MASTECTOMY: POST OP INSTRUCTIONS ° °Always review your discharge instruction sheet given to you by the facility where your surgery was performed. ° °IF YOU HAVE DISABILITY OR FAMILY LEAVE FORMS, YOU MUST BRING THEM TO THE OFFICE FOR PROCESSING.  DO NOT GIVE THEM TO YOUR DOCTOR. ° °1. A prescription for pain medication may be given to you upon discharge.  Take your pain medication as prescribed, if needed.  If narcotic pain medicine is not needed, then you may take acetaminophen (Tylenol) or ibuprofen (Advil) as needed. °2. Take your usually prescribed medications unless otherwise directed °3. If you need a refill on your pain medication, please contact your pharmacy.  They will contact our office to request authorization.  Prescriptions will not be filled after 5pm or on week-ends. °4. You should eat very light the first 24 hours after surgery, such as soup, crackers, pudding, etc.  Resume your normal diet the day after surgery. °5. Most patients will experience some swelling and bruising in the breast.  Ice packs and a good support bra will help.  Swelling and bruising can take several days to resolve.  °6. It is common to experience some constipation if taking pain medication after surgery.  Increasing fluid intake and taking a stool softener will usually help or prevent this problem from occurring.  A mild laxative (Milk of Magnesia or Miralax) should be taken according to package directions if there are no bowel movements after 48 hours. °7. Unless discharge instructions indicate otherwise, you may remove your bandages 24-48 hours after surgery, and you may shower at that time.  You may have steri-strips (small skin tapes) in place directly over the incision.  These strips should be left on the skin for 7-10 days.  If your surgeon used skin glue on the incision, you may shower in 24 hours.  The glue will flake off over the  next 2-3 weeks.  Any sutures or staples will be removed at the office during your follow-up visit. °8. ACTIVITIES:  You may resume regular daily activities (gradually increasing) beginning the next day.  Wearing a good support bra or sports bra minimizes pain and swelling.  You may have sexual intercourse when it is comfortable. °a. You may drive when you no longer are taking prescription pain medication, you can comfortably wear a seatbelt, and you can safely maneuver your car and apply brakes. °b. RETURN TO WORK:  ______________________________________________________________________________________ °9. You should see your doctor in the office for a follow-up appointment approximately two weeks after your surgery.  Your doctor’s nurse will typically make your follow-up appointment when she calls you with your pathology report.  Expect your pathology report 2-3 business days after your surgery.  You may call to check if you do not hear from us after three days. °10. OTHER INSTRUCTIONS: _______________________________________________________________________________________________ _____________________________________________________________________________________________________________________________________ °_____________________________________________________________________________________________________________________________________ °_____________________________________________________________________________________________________________________________________ ° °WHEN TO CALL YOUR DOCTOR: °1. Fever over 101.0 °2. Nausea and/or vomiting. °3. Extreme swelling or bruising. °4. Continued bleeding from incision. °5. Increased pain, redness, or drainage from the incision. ° °The clinic staff is available to answer your questions during regular business hours.  Please don’t hesitate to call and ask to speak to one of the nurses for clinical concerns.  If you have a medical emergency, go to the nearest  emergency room or call 911.  A surgeon from [facility] is always on call at the hospital. ° °For further questions, please visit [email or web address]  ° ° ° ° °  Post Anesthesia Home Care Instructions ° °Activity: °Get plenty of rest for the remainder of the day. A responsible individual must stay with you for 24 hours following the procedure.  °For the next 24 hours, DO NOT: °-Drive a car °-Operate machinery °-Drink alcoholic beverages °-Take any medication unless instructed by your physician °-Make any legal decisions or sign important papers. ° °Meals: °Start with liquid foods such as gelatin or soup. Progress to regular foods as tolerated. Avoid greasy, spicy, heavy foods. If nausea and/or vomiting occur, drink only clear liquids until the nausea and/or vomiting subsides. Call your physician if vomiting continues. ° °Special Instructions/Symptoms: °Your throat may feel dry or sore from the anesthesia or the breathing tube placed in your throat during surgery. If this causes discomfort, gargle with warm salt water. The discomfort should disappear within 24 hours. ° °If you had a scopolamine patch placed behind your ear for the management of post- operative nausea and/or vomiting: ° °1. The medication in the patch is effective for 72 hours, after which it should be removed.  Wrap patch in a tissue and discard in the trash. Wash hands thoroughly with soap and water. °2. You may remove the patch earlier than 72 hours if you experience unpleasant side effects which may include dry mouth, dizziness or visual disturbances. °3. Avoid touching the patch. Wash your hands with soap and water after contact with the patch. °  ° °

## 2017-04-22 ENCOUNTER — Encounter (HOSPITAL_BASED_OUTPATIENT_CLINIC_OR_DEPARTMENT_OTHER): Payer: Self-pay | Admitting: General Surgery

## 2017-04-22 NOTE — Addendum Note (Signed)
Addendum  created 04/22/17 1011 by Tawni Millers, CRNA   Charge Capture section accepted

## 2017-04-23 ENCOUNTER — Ambulatory Visit: Payer: Self-pay | Admitting: General Surgery

## 2017-04-27 ENCOUNTER — Telehealth: Payer: Self-pay | Admitting: *Deleted

## 2017-04-27 ENCOUNTER — Encounter (HOSPITAL_BASED_OUTPATIENT_CLINIC_OR_DEPARTMENT_OTHER): Payer: Self-pay | Admitting: *Deleted

## 2017-04-27 NOTE — Telephone Encounter (Signed)
  Oncology Nurse Navigator Documentation  Navigator Location: [facility] (04/27/17 1600)   )Navigator Encounter Type: Telephone (04/27/17 1600) Telephone: Ruth Nguyen Call;Appt Confirmation/Clarification (04/27/17 1600)                 Treatment Initiated Date: 04/21/17 (04/27/17 1600)     Barriers/Navigation Needs: Coordination of Care (04/27/17 1600)   Interventions: Coordination of Care (04/27/17 1600)            Acuity: Level 2 (04/27/17 1600)         Time Spent with Patient: 15 (04/27/17 1600)

## 2017-04-28 NOTE — H&P (Signed)
History of Present Illness Marland Kitchen T. Aysha Livecchi MD; 04/01/2017 12:21 PM) The patient is a 46 year old female who presents with breast cancer. She is a pre menopausal female referred by Dr. Miquel Dunn for evaluation of recently diagnosed carcinoma of the right breast. her college roommate unfortunately recently passed away from metastatic breast cancer. She thinks it was mostly an emotional reaction but she began to experience left breast pain. It was time for her routine mammogram and she presented and due to the pain was referred for a diagnostic workup. Her left breast was negative but there were abnormalities in the right breast as below. Subsequent imaging included diagnostic mamogram showing asymmetry and distortion in the lower inner right breast and ultrasound showing an irregular hypoechoic mass at the 4:00 position 7 cm from the nipple measuring 9 x 4 x 6 mm. axillary ultrasound was negative An ultrasound guided breast biopsy was performed on 03/23/2017 with pathology revealing invasive ductal carcinoma of the breast. She is seen now in breast multidisciplinary clinic for initial treatment planning. She has experienced left breast pain with negative workup but no symptoms in the right breast specifically palpable mass or nipple discharge or skin changes. She does not have a personal history of any previous breast problems.  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  After discussion we have elected to proceed with re excision of the anterior margin  Past Surgical History (Tawni Pummel RN; 04/01/2017 7:35 AM) Breast Biopsy  Right.  Diagnostic Studies History  (Tawni Pummel RN; 04/01/2017 7:35 AM) Colonoscopy  never Mammogram  within last year Pap Smear  1-5 years ago  Medication History (Tawni Pummel RN; 04/01/2017 7:35 AM) Medications Reconciled  Social History (Tawni Pummel RN; 04/01/2017 7:35 AM) No alcohol use  No caffeine use  No drug use  Tobacco use  Never smoker.  Family History (Tawni Pummel RN; 04/01/2017 7:35 AM) Arthritis  Family Members In General, Mother. Breast Cancer  Family Members In General. Cerebrovascular Accident  Family Members In General. Heart Disease  Family Members In General, Father, Mother.  Pregnancy / Birth History (Tawni Pummel RN; 04/01/2017 7:35 AM) Age at menarche  89years. Contraceptive History  Oral contraceptives. Gravida  3 Length (months) of breastfeeding  7-12 Maternal age  48-30Para  2 Regular periods   Other Problems (Tawni Pummel RN; 04/01/2017 7:35 AM) Back Pain  Lump In Breast  Migraine Headache     Review of Systems (Sunday SpillersLedford RN; 04/01/2017 7:35 AM) General Present- Fatigue. Not Present- Appetite Loss, Chills, Fever, Night Sweats, Weight Gain and Weight Loss. Skin Not Present- Change in Wart/Mole, Dryness, Hives, Jaundice, New Lesions, Non-Healing Wounds, Rash and Ulcer. HEENT Present- Oral Ulcers and Wears glasses/contact lenses. Not Present- Earache, Hearing Loss, Hoarseness, Nose Bleed, Ringing in the Ears, Seasonal Allergies, Sinus Pain, Sore Throat, Visual Disturbances and Yellow Eyes. Respiratory Not Present- Bloody sputum, Chronic Cough, Difficulty Breathing, Snoring and Wheezing. Breast Present- Breast Mass and Breast Pain. Not Present- Nipple Discharge and Skin Changes. Cardiovascular Not Present- Chest Pain, Difficulty Breathing Lying Down, Leg Cramps, Palpitations, Rapid Heart Rate, Shortness of Breath and Swelling of Extremities. Gastrointestinal Not Present- Abdominal Pain, Bloating, Bloody Stool, Change in Bowel  Habits, Chronic  diarrhea, Constipation, Difficulty Swallowing, Excessive gas, Gets full quickly at meals, Hemorrhoids, Indigestion, Nausea, Rectal Pain and Vomiting. Female Genitourinary Not Present- Frequency, Nocturia, Painful Urination, Pelvic Pain and Urgency. Musculoskeletal Present- Back Pain. Not Present- Joint Pain, Joint Stiffness, Muscle Pain, Muscle Weakness and Swelling of Extremities. Neurological Not Present- Decreased Memory, Fainting, Headaches, Numbness, Seizures, Tingling, Tremor, Trouble walking and Weakness. Endocrine Not Present- Cold Intolerance, Excessive Hunger, Hair Changes, Heat Intolerance, Hot flashes and New Diabetes. Hematology Not Present- Blood Thinners, Easy Bruising, Excessive bleeding, Gland problems, HIV and Persistent Infections.   Physical Exam Marland Kitchen T. Jeraline Marcinek MD; 04/01/2017 12:22 PM) The physical exam findings are as follows: Note:General: Alert, well-developed and well nourished Caucasian female, in no distress Skin: Warm and dry without rash or infection. HEENT: No palpable masses or thyromegaly. Sclera nonicteric. Pupils equal round and reactive. Lymph nodes: No cervical, supraclavicular, nodes palpable. breasts: No palpable masses in either breast with particular attention to the biopsy site. No skin changes or nipple crusting or discharge. No palpable axillary adenopathy. Lungs: Breath sounds clear and equal. No wheezing or increased work of breathing. Cardiovascular: Regular rate and rhythm without murmer. No JVD or edema. Abdomen: Nondistended. Soft and nontender. No masses palpable. No organomegaly. Extremities: No edema or joint swelling or deformity. No chronic venous stasis changes. Neurologic: Alert and fully oriented. Gait normal. No focal weakness. Psychiatric: Normal mood and affect. Thought content appropriate with normal judgement and insight    Assessment & Plan Marland Kitchen T. Blima Jaimes MD; 04/01/2017 12:27 PM) MALIGNANT NEOPLASM OF LOWER-INNER  QUADRANT OF RIGHT BREAST OF FEMALE, ESTROGEN RECEPTOR POSITIVE (C50.311) Impression: 46 year old female with a new diagnosis of cancer of the right breast, lower inner quadrant. Clinical stage 1 a, ER positive, PR positive, HER-2 negative.  S\P lumpectomy and - SLN Bx with focally positive anterior margin for DCIS  Plan re excision of the anterior margin under general anesthesia

## 2017-04-29 ENCOUNTER — Ambulatory Visit (HOSPITAL_BASED_OUTPATIENT_CLINIC_OR_DEPARTMENT_OTHER)
Admission: RE | Admit: 2017-04-29 | Discharge: 2017-04-29 | Disposition: A | Discharge status: Home / Self Care | Payer: BC Managed Care – PPO | Source: Ambulatory Visit | Attending: General Surgery | Admitting: General Surgery

## 2017-04-29 ENCOUNTER — Encounter: Payer: Self-pay | Admitting: Radiation Oncology

## 2017-04-29 ENCOUNTER — Ambulatory Visit (HOSPITAL_BASED_OUTPATIENT_CLINIC_OR_DEPARTMENT_OTHER): Payer: BC Managed Care – PPO | Admitting: Anesthesiology

## 2017-04-29 ENCOUNTER — Encounter (HOSPITAL_BASED_OUTPATIENT_CLINIC_OR_DEPARTMENT_OTHER)
Admission: RE | Disposition: A | Discharge status: Home / Self Care | Payer: Self-pay | Source: Ambulatory Visit | Attending: General Surgery

## 2017-04-29 ENCOUNTER — Encounter (HOSPITAL_BASED_OUTPATIENT_CLINIC_OR_DEPARTMENT_OTHER): Payer: Self-pay | Admitting: Anesthesiology

## 2017-04-29 DIAGNOSIS — C50911 Malignant neoplasm of unspecified site of right female breast: Secondary | ICD-10-CM | POA: Diagnosis present

## 2017-04-29 DIAGNOSIS — C50311 Malignant neoplasm of lower-inner quadrant of right female breast: Secondary | ICD-10-CM | POA: Diagnosis not present

## 2017-04-29 DIAGNOSIS — Z170 Estrogen receptor positive status [ER+]: Secondary | ICD-10-CM | POA: Insufficient documentation

## 2017-04-29 HISTORY — PX: RE-EXCISION OF BREAST LUMPECTOMY: SHX6048

## 2017-04-29 HISTORY — DX: Other specified postprocedural states: Z98.890

## 2017-04-29 HISTORY — DX: Adverse effect of unspecified anesthetic, initial encounter: T41.45XA

## 2017-04-29 HISTORY — DX: Nausea with vomiting, unspecified: R11.2

## 2017-04-29 HISTORY — DX: Other complications of anesthesia, initial encounter: T88.59XA

## 2017-04-29 SURGERY — EXCISION, LESION, BREAST
Anesthesia: General | Site: Breast | Laterality: Right

## 2017-04-29 MED ORDER — CHLORHEXIDINE GLUCONATE CLOTH 2 % EX PADS: 6.0000 | MEDICATED_PAD | Freq: Once | CUTANEOUS | Status: DC

## 2017-04-29 MED ORDER — METOCLOPRAMIDE HCL 5 MG/ML IJ SOLN: 10.0000 mg | Freq: Once | INTRAMUSCULAR | Status: DC | PRN

## 2017-04-29 MED ORDER — PROPOFOL 10 MG/ML IV BOLUS
INTRAVENOUS | Status: AC
  Filled 2017-04-29: qty 20

## 2017-04-29 MED ORDER — ONDANSETRON HCL 4 MG/2ML IJ SOLN
INTRAMUSCULAR | Status: DC | PRN
  Administered 2017-04-29: 4 mg via INTRAVENOUS

## 2017-04-29 MED ORDER — CIPROFLOXACIN IN D5W 400 MG/200ML IV SOLN
INTRAVENOUS | Status: AC
  Filled 2017-04-29: qty 200

## 2017-04-29 MED ORDER — DEXAMETHASONE SODIUM PHOSPHATE 10 MG/ML IJ SOLN
INTRAMUSCULAR | Status: AC
  Filled 2017-04-29: qty 1

## 2017-04-29 MED ORDER — CIPROFLOXACIN IN D5W 400 MG/200ML IV SOLN
400.0000 mg | INTRAVENOUS | Status: AC
  Administered 2017-04-29: 400 mg via INTRAVENOUS

## 2017-04-29 MED ORDER — MIDAZOLAM HCL 2 MG/2ML IJ SOLN
INTRAMUSCULAR | Status: AC
  Filled 2017-04-29: qty 2

## 2017-04-29 MED ORDER — BUPIVACAINE-EPINEPHRINE (PF) 0.5% -1:200000 IJ SOLN
INTRAMUSCULAR | Status: AC
  Filled 2017-04-29: qty 30

## 2017-04-29 MED ORDER — LIDOCAINE HCL (CARDIAC) 20 MG/ML IV SOLN
INTRAVENOUS | Status: DC | PRN
  Administered 2017-04-29: 30 mg via INTRAVENOUS

## 2017-04-29 MED ORDER — LACTATED RINGERS IV SOLN
INTRAVENOUS | Status: DC
  Administered 2017-04-29 (×2): via INTRAVENOUS

## 2017-04-29 MED ORDER — MIDAZOLAM HCL 2 MG/2ML IJ SOLN: 1.0000 mg | INTRAMUSCULAR | Status: DC | PRN

## 2017-04-29 MED ORDER — ONDANSETRON HCL 4 MG/2ML IJ SOLN
INTRAMUSCULAR | Status: AC
  Filled 2017-04-29: qty 2

## 2017-04-29 MED ORDER — FENTANYL CITRATE (PF) 100 MCG/2ML IJ SOLN: 50.0000 ug | INTRAMUSCULAR | Status: DC | PRN

## 2017-04-29 MED ORDER — SCOPOLAMINE 1 MG/3DAYS TD PT72: 1.0000 | MEDICATED_PATCH | Freq: Once | TRANSDERMAL | Status: DC | PRN

## 2017-04-29 MED ORDER — ACETAMINOPHEN 500 MG PO TABS
1000.0000 mg | ORAL_TABLET | ORAL | Status: AC
  Administered 2017-04-29: 1000 mg via ORAL

## 2017-04-29 MED ORDER — CELECOXIB 200 MG PO CAPS
ORAL_CAPSULE | ORAL | Status: AC
  Filled 2017-04-29: qty 2

## 2017-04-29 MED ORDER — LACTATED RINGERS IV SOLN: INTRAVENOUS | Status: DC

## 2017-04-29 MED ORDER — DEXAMETHASONE SODIUM PHOSPHATE 4 MG/ML IJ SOLN
INTRAMUSCULAR | Status: DC | PRN
Start: 2017-04-29 — End: 2017-04-29
  Administered 2017-04-29: 10 mg via INTRAVENOUS

## 2017-04-29 MED ORDER — FENTANYL CITRATE (PF) 100 MCG/2ML IJ SOLN
INTRAMUSCULAR | Status: AC
  Filled 2017-04-29: qty 2

## 2017-04-29 MED ORDER — MEPERIDINE HCL 25 MG/ML IJ SOLN: 6.2500 mg | INTRAMUSCULAR | Status: DC | PRN

## 2017-04-29 MED ORDER — ACETAMINOPHEN 500 MG PO TABS
ORAL_TABLET | ORAL | Status: AC
  Filled 2017-04-29: qty 2

## 2017-04-29 MED ORDER — SCOPOLAMINE 1 MG/3DAYS TD PT72
MEDICATED_PATCH | TRANSDERMAL | Status: AC
  Filled 2017-04-29: qty 1

## 2017-04-29 MED ORDER — FENTANYL CITRATE (PF) 100 MCG/2ML IJ SOLN: 25.0000 ug | INTRAMUSCULAR | Status: DC | PRN

## 2017-04-29 MED ORDER — MIDAZOLAM HCL 5 MG/5ML IJ SOLN
INTRAMUSCULAR | Status: DC | PRN
  Administered 2017-04-29: 2 mg via INTRAVENOUS

## 2017-04-29 MED ORDER — CELECOXIB 400 MG PO CAPS
400.0000 mg | ORAL_CAPSULE | ORAL | Status: AC
  Administered 2017-04-29: 400 mg via ORAL

## 2017-04-29 MED ORDER — BUPIVACAINE-EPINEPHRINE 0.5% -1:200000 IJ SOLN
INTRAMUSCULAR | Status: DC | PRN
  Administered 2017-04-29: 20 mL

## 2017-04-29 MED ORDER — GABAPENTIN 300 MG PO CAPS
ORAL_CAPSULE | ORAL | Status: AC
  Filled 2017-04-29: qty 1

## 2017-04-29 MED ORDER — SCOPOLAMINE 1 MG/3DAYS TD PT72
MEDICATED_PATCH | TRANSDERMAL | Status: DC | PRN
  Administered 2017-04-29: 1 via TRANSDERMAL

## 2017-04-29 MED ORDER — PROPOFOL 10 MG/ML IV BOLUS
INTRAVENOUS | Status: DC | PRN
  Administered 2017-04-29: 200 mg via INTRAVENOUS

## 2017-04-29 MED ORDER — GABAPENTIN 300 MG PO CAPS
300.0000 mg | ORAL_CAPSULE | ORAL | Status: AC
  Administered 2017-04-29: 300 mg via ORAL

## 2017-04-29 MED ORDER — LIDOCAINE HCL (CARDIAC) 20 MG/ML IV SOLN
INTRAVENOUS | Status: AC
  Filled 2017-04-29: qty 5

## 2017-04-29 MED ORDER — FENTANYL CITRATE (PF) 100 MCG/2ML IJ SOLN
INTRAMUSCULAR | Status: DC | PRN
  Administered 2017-04-29: 100 ug via INTRAVENOUS

## 2017-04-29 SURGICAL SUPPLY — 39 items
BLADE SURG 15 STRL LF DISP TIS (BLADE) ×1 IMPLANT
BLADE SURG 15 STRL SS (BLADE) ×2
CANISTER SUCT 1200ML W/VALVE (MISCELLANEOUS) ×3 IMPLANT
CHLORAPREP W/TINT 26ML (MISCELLANEOUS) ×3 IMPLANT
CLIP VESOCCLUDE SM WIDE 6/CT (CLIP) IMPLANT
COVER BACK TABLE 60X90IN (DRAPES) ×3 IMPLANT
COVER MAYO STAND STRL (DRAPES) ×3 IMPLANT
DERMABOND ADVANCED (GAUZE/BANDAGES/DRESSINGS) ×2
DERMABOND ADVANCED .7 DNX12 (GAUZE/BANDAGES/DRESSINGS) ×1 IMPLANT
DEVICE DUBIN W/COMP PLATE 8390 (MISCELLANEOUS) IMPLANT
DRAPE LAPAROSCOPIC ABDOMINAL (DRAPES) ×3 IMPLANT
DRAPE UTILITY XL STRL (DRAPES) ×3 IMPLANT
ELECT COATED BLADE 2.86 ST (ELECTRODE) ×3 IMPLANT
ELECT REM PT RETURN 9FT ADLT (ELECTROSURGICAL) ×3
ELECTRODE REM PT RTRN 9FT ADLT (ELECTROSURGICAL) ×1 IMPLANT
GLOVE BIO SURGEON STRL SZ 6.5 (GLOVE) ×2 IMPLANT
GLOVE BIO SURGEONS STRL SZ 6.5 (GLOVE) ×1
GLOVE BIOGEL PI IND STRL 8 (GLOVE) ×1 IMPLANT
GLOVE BIOGEL PI INDICATOR 8 (GLOVE) ×2
GLOVE ECLIPSE 7.5 STRL STRAW (GLOVE) ×3 IMPLANT
GOWN STRL REUS W/ TWL LRG LVL3 (GOWN DISPOSABLE) ×1 IMPLANT
GOWN STRL REUS W/ TWL XL LVL3 (GOWN DISPOSABLE) ×1 IMPLANT
GOWN STRL REUS W/TWL LRG LVL3 (GOWN DISPOSABLE) ×2
GOWN STRL REUS W/TWL XL LVL3 (GOWN DISPOSABLE) ×2
KIT MARKER MARGIN INK (KITS) IMPLANT
NEEDLE HYPO 25X1 1.5 SAFETY (NEEDLE) ×3 IMPLANT
NS IRRIG 1000ML POUR BTL (IV SOLUTION) IMPLANT
PACK BASIN DAY SURGERY FS (CUSTOM PROCEDURE TRAY) ×3 IMPLANT
PENCIL BUTTON HOLSTER BLD 10FT (ELECTRODE) ×3 IMPLANT
SLEEVE SCD COMPRESS KNEE MED (MISCELLANEOUS) ×3 IMPLANT
SUT MON AB 5-0 PS2 18 (SUTURE) ×3 IMPLANT
SUT VICRYL 3-0 CR8 SH (SUTURE) ×3 IMPLANT
SYR BULB 3OZ (MISCELLANEOUS) IMPLANT
SYR CONTROL 10ML LL (SYRINGE) ×3 IMPLANT
TOWEL OR 17X24 6PK STRL BLUE (TOWEL DISPOSABLE) ×3 IMPLANT
TOWEL OR NON WOVEN STRL DISP B (DISPOSABLE) ×3 IMPLANT
TUBE CONNECTING 20'X1/4 (TUBING) ×1
TUBE CONNECTING 20X1/4 (TUBING) ×2 IMPLANT
YANKAUER SUCT BULB TIP NO VENT (SUCTIONS) ×3 IMPLANT

## 2017-04-29 NOTE — Anesthesia Preprocedure Evaluation (Signed)
Anesthesia Evaluation  Patient identified by MRN, date of birth, ID band Patient awake    Reviewed: Allergy & Precautions, NPO status , Patient's Chart, lab work & pertinent test results  History of Anesthesia Complications (+) PONV  Airway Mallampati: II  TM Distance: >3 FB Neck ROM: Full    Dental no notable dental hx.    Pulmonary neg pulmonary ROS,    Pulmonary exam normal breath sounds clear to auscultation       Cardiovascular negative cardio ROS Normal cardiovascular exam Rhythm:Regular Rate:Normal     Neuro/Psych negative neurological ROS  negative psych ROS   GI/Hepatic negative GI ROS, Neg liver ROS,   Endo/Other  negative endocrine ROS  Renal/GU negative Renal ROS  negative genitourinary   Musculoskeletal negative musculoskeletal ROS (+)   Abdominal   Peds negative pediatric ROS (+)  Hematology negative hematology ROS (+)   Anesthesia Other Findings   Reproductive/Obstetrics negative OB ROS                             Anesthesia Physical Anesthesia Plan  ASA: II  Anesthesia Plan: General   Post-op Pain Management:    Induction: Intravenous  PONV Risk Score and Plan: 4 or greater and Ondansetron, Dexamethasone, Propofol, Midazolam and Scopolamine patch - Pre-op  Airway Management Planned: LMA  Additional Equipment:   Intra-op Plan:   Post-operative Plan: Extubation in OR  Informed Consent: I have reviewed the patients History and Physical, chart, labs and discussed the procedure including the risks, benefits and alternatives for the proposed anesthesia with the patient or authorized representative who has indicated his/her understanding and acceptance.   Dental advisory given  Plan Discussed with: CRNA  Anesthesia Plan Comments:         Anesthesia Quick Evaluation

## 2017-04-29 NOTE — Anesthesia Procedure Notes (Signed)
Procedure Name: LMA Insertion Date/Time: 04/29/2017 10:06 AM Performed by: Toula Moos L Pre-anesthesia Checklist: Patient identified, Emergency Drugs available, Suction available, Patient being monitored and Timeout performed Patient Re-evaluated:Patient Re-evaluated prior to induction Oxygen Delivery Method: Circle system utilized Preoxygenation: Pre-oxygenation with 100% oxygen Induction Type: IV induction Ventilation: Mask ventilation without difficulty LMA: LMA inserted LMA Size: 4.0 Number of attempts: 1 Airway Equipment and Method: Bite block Placement Confirmation: positive ETCO2 Tube secured with: Tape Dental Injury: Teeth and Oropharynx as per pre-operative assessment

## 2017-04-29 NOTE — Interval H&P Note (Signed)
History and Physical Interval Note:  04/29/2017 9:56 AM  Ruth Nguyen  has presented today for surgery, with the diagnosis of cancer right breast  The various methods of treatment have been discussed with the patient and family. After consideration of risks, benefits and other options for treatment, the patient has consented to  Procedure(s) with comments: RIGHT RE-EXCISION OF BREAST LUMPECTOMY (Right) - ERAS PATHWAY as a surgical intervention .  The patient's history has been reviewed, patient examined, no change in status, stable for surgery.  I have reviewed the patient's chart and labs.  Questions were answered to the patient's satisfaction.     [name]

## 2017-04-29 NOTE — Anesthesia Postprocedure Evaluation (Signed)
Anesthesia Post Note  Patient: Ruth Nguyen  Procedure(s) Performed: Procedure(s) (LRB): RIGHT RE-EXCISION OF BREAST LUMPECTOMY (Right)     Patient location during evaluation: PACU Anesthesia Type: General Level of consciousness: awake and alert Pain management: pain level controlled Vital Signs Assessment: post-procedure vital signs reviewed and stable Respiratory status: spontaneous breathing, nonlabored ventilation, respiratory function stable and patient connected to nasal cannula oxygen Cardiovascular status: blood pressure returned to baseline and stable Postop Assessment: no signs of nausea or vomiting Anesthetic complications: no    Last Vitals:  Vitals:   04/29/17 1200 04/29/17 1232  BP: 127/76 127/72  Pulse: 74 77  Resp: 16 16  Temp: 36.6 C 36.6 C    Last Pain:  Vitals:   04/29/17 1232  TempSrc: Oral  PainSc: 0-No pain                 Montez Hageman

## 2017-04-29 NOTE — Discharge Instructions (Signed)
[facility] °Office Phone Number [phone] ° °BREAST BIOPSY/ PARTIAL MASTECTOMY: POST OP INSTRUCTIONS ° °Always review your discharge instruction sheet given to you by the facility where your surgery was performed. ° °IF YOU HAVE DISABILITY OR FAMILY LEAVE FORMS, YOU MUST BRING THEM TO THE OFFICE FOR PROCESSING.  DO NOT GIVE THEM TO YOUR DOCTOR. ° °1. A prescription for pain medication may be given to you upon discharge.  Take your pain medication as prescribed, if needed.  If narcotic pain medicine is not needed, then you may take acetaminophen (Tylenol) or ibuprofen (Advil) as needed. °2. Take your usually prescribed medications unless otherwise directed °3. If you need a refill on your pain medication, please contact your pharmacy.  They will contact our office to request authorization.  Prescriptions will not be filled after 5pm or on week-ends. °4. You should eat very light the first 24 hours after surgery, such as soup, crackers, pudding, etc.  Resume your normal diet the day after surgery. °5. Most patients will experience some swelling and bruising in the breast.  Ice packs and a good support bra will help.  Swelling and bruising can take several days to resolve.  °6. It is common to experience some constipation if taking pain medication after surgery.  Increasing fluid intake and taking a stool softener will usually help or prevent this problem from occurring.  A mild laxative (Milk of Magnesia or Miralax) should be taken according to package directions if there are no bowel movements after 48 hours. °7. Unless discharge instructions indicate otherwise, you may remove your bandages 24-48 hours after surgery, and you may shower at that time.  You may have steri-strips (small skin tapes) in place directly over the incision.  These strips should be left on the skin for 7-10 days.  If your surgeon used skin glue on the incision, you may shower in 24 hours.  The glue will flake off over the  next 2-3 weeks.  Any sutures or staples will be removed at the office during your follow-up visit. °8. ACTIVITIES:  You may resume regular daily activities (gradually increasing) beginning the next day.  Wearing a good support bra or sports bra minimizes pain and swelling.  You may have sexual intercourse when it is comfortable. °a. You may drive when you no longer are taking prescription pain medication, you can comfortably wear a seatbelt, and you can safely maneuver your car and apply brakes. °b. RETURN TO WORK:  ______________________________________________________________________________________ °9. You should see your doctor in the office for a follow-up appointment approximately two weeks after your surgery.  Your doctor’s nurse will typically make your follow-up appointment when she calls you with your pathology report.  Expect your pathology report 2-3 business days after your surgery.  You may call to check if you do not hear from us after three days. °10. OTHER INSTRUCTIONS: _______________________________________________________________________________________________ _____________________________________________________________________________________________________________________________________ °_____________________________________________________________________________________________________________________________________ °_____________________________________________________________________________________________________________________________________ ° °WHEN TO CALL YOUR DOCTOR: °1. Fever over 101.0 °2. Nausea and/or vomiting. °3. Extreme swelling or bruising. °4. Continued bleeding from incision. °5. Increased pain, redness, or drainage from the incision. ° °The clinic staff is available to answer your questions during regular business hours.  Please don’t hesitate to call and ask to speak to one of the nurses for clinical concerns.  If you have a medical emergency, go to the nearest  emergency room or call 911.  A surgeon from [facility] is always on call at the hospital. ° °For further questions, please visit [email or web address]  ° ° ° ° °  Post Anesthesia Home Care Instructions ° °Activity: °Get plenty of rest for the remainder of the day. A responsible individual must stay with you for 24 hours following the procedure.  °For the next 24 hours, DO NOT: °-Drive a car °-Operate machinery °-Drink alcoholic beverages °-Take any medication unless instructed by your physician °-Make any legal decisions or sign important papers. ° °Meals: °Start with liquid foods such as gelatin or soup. Progress to regular foods as tolerated. Avoid greasy, spicy, heavy foods. If nausea and/or vomiting occur, drink only clear liquids until the nausea and/or vomiting subsides. Call your physician if vomiting continues. ° °Special Instructions/Symptoms: °Your throat may feel dry or sore from the anesthesia or the breathing tube placed in your throat during surgery. If this causes discomfort, gargle with warm salt water. The discomfort should disappear within 24 hours. ° °If you had a scopolamine patch placed behind your ear for the management of post- operative nausea and/or vomiting: ° °1. The medication in the patch is effective for 72 hours, after which it should be removed.  Wrap patch in a tissue and discard in the trash. Wash hands thoroughly with soap and water. °2. You may remove the patch earlier than 72 hours if you experience unpleasant side effects which may include dry mouth, dizziness or visual disturbances. °3. Avoid touching the patch. Wash your hands with soap and water after contact with the patch. °  ° °

## 2017-04-29 NOTE — Op Note (Signed)
Preoperative Diagnosis: cancer right breast  Postoprative Diagnosis: cancer right breast  Procedure: Procedure(s): RIGHT RE-EXCISION OF BREAST LUMPECTOMY   Surgeon: Excell Seltzer T   Assistants: None  Anesthesia:  General LMA anesthesia  Indications: Patient is one-week status post right breast lumpectomy and axillary sentinel lymph node biopsy for a 0.9 cm invasive ductal carcinoma of the lower outer right breast and negative sentinel lymph node biopsy. Final pathology showed clear margins on the invasive disease but there was associated DCIS with a focally positive anterior margin. After discussion with the patient we have recommended reexcision of the anterior margin of her lumpectomy. We discussed the indications and nature of surgery and risks and all questions answered.    Procedure Detail:  Patient was brought to the operating room, placed in supine position on the operating table, and laryngeal mask general anesthesia induced. The right breast was widely sterilely prepped and draped. She received preoperative IV antibiotics. PAS were in place. Patient timeout was performed and correct procedure verified. The previous incision which was healing nicely was sharply opened and dissection carried down through the subcutaneous tissue. The incision was in the inframammary crease and I had developed the skin and subcutaneous flap superiorly over the area of the lumpectomy. The lumpectomy cavity was completely widely opened. The anterior margin of the lumpectomy cavity was widely exposed. Examination of this showed the central portion was skin only. There was at least subcutaneous and possibly some breast tissue medial and lateral to this. I excised the anterior margin in 2 portions medially and laterally to the portion of the margin that was essentially skin. Both of these were taken back to nearly skin. Each was labeled separately and the new margin inked. These were sent for permanent  pathology. There is no palpable abnormality in the breast tissue. The wound was irrigated and complete hemostasis assured. Soft tissue was infiltrated with Marcaine. The deep breast and subcutaneous tissue was closed with interrupted 3-0 Vicryl and the skin with running subcuticular 5-0 Monocryl and Dermabond. Sponge needle and instrument counts were correct.    Findings: As above  Estimated Blood Loss:  Minimal         Drains: None  Blood Given: none          Specimens: #1 right breast lumpectomy further anterior margin lateral aspect #2 right breast lumpectomy further anterior margin medial aspect        Complications:  * No complications entered in OR log *         Disposition: PACU - hemodynamically stable.         Condition: stable

## 2017-04-29 NOTE — Transfer of Care (Signed)
Immediate Anesthesia Transfer of Care Note  Patient: Ruth Nguyen  Procedure(s) Performed: Procedure(s) with comments: RIGHT RE-EXCISION OF BREAST LUMPECTOMY (Right) - ERAS PATHWAY  Patient Location: PACU  Anesthesia Type:General  Level of Consciousness: awake and patient cooperative  Airway & Oxygen Therapy: Patient Spontanous Breathing and Patient connected to face mask oxygen  Post-op Assessment: Report given to RN and Post -op Vital signs reviewed and stable  Post vital signs: Reviewed and stable  Last Vitals:  Vitals:   04/29/17 0905  BP: 95/72  Pulse: 89  Resp: 20  Temp: 36.7 C    Last Pain:  Vitals:   04/29/17 0905  TempSrc: Oral         Complications: No apparent anesthesia complications

## 2017-04-30 ENCOUNTER — Encounter (HOSPITAL_BASED_OUTPATIENT_CLINIC_OR_DEPARTMENT_OTHER): Payer: Self-pay | Admitting: General Surgery

## 2017-05-04 ENCOUNTER — Ambulatory Visit: Payer: BC Managed Care – PPO

## 2017-05-04 ENCOUNTER — Ambulatory Visit: Payer: BC Managed Care – PPO | Admitting: Radiation Oncology

## 2017-05-13 HISTORY — PX: BREAST LUMPECTOMY: SHX2

## 2017-05-15 NOTE — Progress Notes (Addendum)
Location of Breast Cancer: lower-inner quadrant of right breast   Histology per Pathology Report:   04/29/17 Diagnosis 1. Breast, excision, Right Anterior Lateral Margin - BENIGN BREAST TISSUE WITH PREVIOUS BIOPSY SITE. - NO EVIDENCE OF MALIGNANCY. - FINAL ANTERIOR MARGIN CLEAR. 2. Breast, excision, Right Anterior Medial Margin - FIBROCYSTIC CHANGES WITH CALCIFICATIONS AND PREVIOUS BIOPSY SITE. - NO EVIDENCE OF MALIGNANCY. - FINAL ANTERIOR MEDIAL MARGIN CLEAR.  04/21/17 Diagnosis 1. Breast, lumpectomy, Right w/seed - INVASIVE DUCTAL CARCINOMA WITH CALCIFICATIONS, GRADE I/III, SPANNING 0.9 CM. - DUCTAL CARCINOMA IN SITU, INTERMEDIATE GRADE. - DUCTAL CARCINOMA IN SITU IS FOCALLY PRESENT AT THE ANTERIOR MARGIN. - SEE ONCOLOGY TABLE BELOW. 2. Lymph node, sentinel, biopsy, Right Axillary #1 - THERE IS NO EVIDENCE OF CARCINOMA IN 1 OF 1 LYMPH NODE (0/1).  Receptor Status: ER(95%), PR (100%), Her2-neu (negative), Ki-(5%)  Did patient present with symptoms (if so, please note symptoms) or was this found on screening mammography?: pain in her left breast   Past/Anticipated interventions by surgeon, if any: 04/29/17 Procedure: RIGHT RE-EXCISION OF BREAST LUMPECTOMY;  Surgeon: Excell Seltzer, MD, 04/21/17 - Procedure: BREAST LUMPECTOMY WITH RADIOACTIVE SEED AND SENTINEL LYMPH NODE BIOPSY;  Surgeon: Excell Seltzer, MD  Past/Anticipated interventions by medical oncology, if any: tamoxifen started neoadjuvantly 04/01/2017 given possibility of some surgical delays  Lymphedema issues, if any:  no  Pain issues, if any:  Patient has some soreness in her right breast.  She also has a headache today.  SAFETY ISSUES:  Prior radiation? no  Pacemaker/ICD? no  Possible current pregnancy?no  Is the patient on methotrexate? no  Current Complaints / other details:  Patient is here with her friend.  She saw her surgeon on Monday and was diagnosed with cellulitis.  She is currently taking  doxycycline.  Patient needs appointments after 3 pm.  BP 116/80 (BP Location: Left Arm, Patient Position: Sitting)   Pulse 86   Temp 98.3 F (36.8 C) (Oral)   Ht _0  (1.651 m)   Wt 223 lb 12.8 oz (101.5 kg)   LMP 05/11/2017 (Approximate)   SpO2 98%   BMI 37.24 kg/m    Wt Readings from Last 3 Encounters:  05/20/17 223 lb 12.8 oz (101.5 kg)  04/29/17 221 lb (100.2 kg)  04/21/17 222 lb (100.7 kg)      Jacqulyn Liner, RN 05/15/2017,7:53 AM

## 2017-05-20 ENCOUNTER — Ambulatory Visit
Admission: RE | Admit: 2017-05-20 | Discharge: 2017-05-20 | Disposition: A | Discharge status: Home / Self Care | Payer: BC Managed Care – PPO | Source: Ambulatory Visit | Attending: Radiation Oncology | Admitting: Radiation Oncology

## 2017-05-20 ENCOUNTER — Encounter: Payer: Self-pay | Admitting: Radiation Oncology

## 2017-05-20 DIAGNOSIS — C50311 Malignant neoplasm of lower-inner quadrant of right female breast: Secondary | ICD-10-CM

## 2017-05-20 DIAGNOSIS — Z825 Family history of asthma and other chronic lower respiratory diseases: Secondary | ICD-10-CM | POA: Diagnosis not present

## 2017-05-20 DIAGNOSIS — Z803 Family history of malignant neoplasm of breast: Secondary | ICD-10-CM | POA: Diagnosis not present

## 2017-05-20 DIAGNOSIS — Z823 Family history of stroke: Secondary | ICD-10-CM | POA: Diagnosis not present

## 2017-05-20 DIAGNOSIS — Z170 Estrogen receptor positive status [ER+]: Principal | ICD-10-CM

## 2017-05-20 DIAGNOSIS — Z9889 Other specified postprocedural states: Secondary | ICD-10-CM | POA: Diagnosis not present

## 2017-05-20 DIAGNOSIS — Z880 Allergy status to penicillin: Secondary | ICD-10-CM | POA: Diagnosis not present

## 2017-05-20 DIAGNOSIS — Z818 Family history of other mental and behavioral disorders: Secondary | ICD-10-CM | POA: Diagnosis not present

## 2017-05-20 DIAGNOSIS — Z510 Encounter for antineoplastic radiation therapy: Secondary | ICD-10-CM | POA: Diagnosis not present

## 2017-05-20 DIAGNOSIS — Z79899 Other long term (current) drug therapy: Secondary | ICD-10-CM | POA: Diagnosis not present

## 2017-05-20 DIAGNOSIS — Z8261 Family history of arthritis: Secondary | ICD-10-CM | POA: Diagnosis not present

## 2017-05-20 DIAGNOSIS — Z800 Family history of malignant neoplasm of digestive organs: Secondary | ICD-10-CM | POA: Diagnosis not present

## 2017-05-20 DIAGNOSIS — G43909 Migraine, unspecified, not intractable, without status migrainosus: Secondary | ICD-10-CM | POA: Diagnosis not present

## 2017-05-20 DIAGNOSIS — Z79891 Long term (current) use of opiate analgesic: Secondary | ICD-10-CM | POA: Diagnosis not present

## 2017-05-20 NOTE — Progress Notes (Signed)
Radiation Oncology         ([phone]) [phone] ________________________________  Name: Ruth Nguyen MRN: [id number]  Date: 05/20/2017  DOB: 19-Oct-1970  Follow-Up/ Re-evaluation Visit Note  CC: Scifres, Dorothy, PA-C  Magrinat, Virgie Dad, MD    ICD-10-CM   1. Malignant neoplasm of lower-inner quadrant of right breast of female, estrogen receptor positive ([place]) C50.311    Z17.0     Diagnosis:   46 y.o. 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  Narrative:  The patient returns today for follow-up/ Re-evaluation visit. A diagnostic mammogram and ultrasound were performed on 03/19/2017. These scans revealed an irregular hypoechoic mass within the right breast at the 4 o'clock axis, 7 cm from the nipple, measuring 9 mm. There was no evidence of malignancy seen in the left breast. Biopsy of the right breast at 4 o'clock position was performed on 03/23/2017. This revealed invasive ductal carcinoma, grade 1, ER 95% / PR 100% / HER-2 negative / Ki-67 5%.  Patient has had two surgeries and a ER visit after last appointment. She underwent a lumpectomy and sentinel node procedure. In light of the positive anterior margins she  proceeded to undergo reexcision, clearing the anterior margin She reported that ER visit was due to back pain, CT scans were done and her diagnosis was sprain to the lumbar. Patient said that she felt her "breast warm to touch and rigid"  and went To Dr. Lear Ng [facility]. The patient was recently placed on doxycycline. Patient also said that she has only taken two oxycodone since surgery, and uses ice packs for pain. .   She expressed how her arm "falls asleep" at time, usually when doing a lot of movement, but no swelling of the arm. She says she feels like she has "limited range of motion" at times.    ALLERGIES:  is allergic to penicillins.  Meds: Current Outpatient Prescriptions  Medication Sig Dispense Refill  .  amitriptyline (ELAVIL) 25 MG tablet Take 25 mg by mouth daily.  1  . doxycycline (ADOXA) 100 MG tablet Take 100 mg by mouth 2 (two) times daily.    Marland Kitchen HYDROcodone-acetaminophen (NORCO) 5-325 MG tablet Take 1 tablet by mouth every 4 (four) hours as needed for moderate pain. 10 tablet 0  . albuterol (ACCUNEB) 0.63 MG/3ML nebulizer solution Take 1 ampule by nebulization every 6 (six) hours as needed for wheezing.     No current facility-administered medications for this encounter.     Physical Findings: The patient is in no acute distress. Patient is alert and oriented.  height is 5' 5"  (1.651 m) and weight is 223 lb 12.8 oz (101.5 kg). Her oral temperature is 98.3 F (36.8 C). Her blood pressure is 116/80 and her pulse is 86. Her oxygen saturation is 98%. .  No significant changes.  Lungs are clear to auscultation bilaterally. Heart has regular rate and rhythm. No palpable cervical, supraclavicular, or axillary adenopathy. Abdomen soft, non-tender, normal bowel sounds.  Left breast no palpable mass or nipple discharge.  right breast, patient has a horizontal scar in the inframammary fold of right breast, healing well with no signs or driainage or infection.Patient has 2 erythematous areas in the inferior aspect of the right breast both measuring approximately 3 cm in size. This is concerning for possible cellulitis    (pateint was started on doxycycline  two days ago , 05/18/17).  She has good range of motion movement of her right  shoulder and arm.    Lab Findings: Lab Results  Component Value Date   WBC 5.8 04/01/2017   HGB 13.2 04/01/2017   HCT 40.2 04/01/2017   MCV 90.1 04/01/2017   PLT 328 04/01/2017    Radiographic Findings: Nm Sentinel Node Inj-no Rpt (breast)  Result Date: 05/18/2017 There is no Radiologist interpretation  for this exam.  Mm Breast Surgical Specimen  Result Date: 04/21/2017 CLINICAL DATA:  Status post radioactive seed localized lumpectomy for grade 1 invasive  ductal carcinoma in the right breast. EXAM: SPECIMEN RADIOGRAPH OF THE RIGHT BREAST COMPARISON:  Previous exam(s). FINDINGS: Status post excision of the right breast. The radioactive seed and biopsy marker clip are present, completely intact, and were marked for pathology. IMPRESSION: Specimen radiograph of the right breast. Electronically Signed   By: Nolon Nations M.D.   On: 04/21/2017 14:17    Impression:  Invasive ductal carcinoma, Grade 1 (pT1b, pN0).  The patient is recovering from her recent surgeries. She would be a good candidate for breast conservation with radiation therapy directed at the right breast.  She signed the consent treatment form today was informed of what to expect during and after treatment,   Plan:  Patient will be in simulation, approximately 5 weeks after surgery. Treatment will begin approximately  6 weeks post op,  approximately 6 weeks of radiation therapy ____________________________________ -----------------------------------  Blair Promise, PhD, MD  This document serves as a record of services personally performed by Gery Pray MD. It was created on his behalf by Delton Coombes, a trained medical scribe. The creation of this record is based on the scribe's personal observations and the provider's statements to them. This document has been checked and approved by the attending provider.

## 2017-05-20 NOTE — Progress Notes (Signed)
Please see the Nurse Progress Note in the MD Initial Consult Encounter for this patient. 

## 2017-05-29 ENCOUNTER — Encounter: Payer: Self-pay | Admitting: Radiation Oncology

## 2017-05-29 NOTE — Progress Notes (Signed)
Paperwork received from doctor, faxed to attn: Baldo Daub @ [phone], confirmation received 05/29/17, copy given to patient 06/01/17

## 2017-06-01 ENCOUNTER — Ambulatory Visit
Admission: RE | Admit: 2017-06-01 | Discharge: 2017-06-01 | Disposition: A | Discharge status: Home / Self Care | Payer: BC Managed Care – PPO | Source: Ambulatory Visit | Attending: Radiation Oncology | Admitting: Radiation Oncology

## 2017-06-01 DIAGNOSIS — Z170 Estrogen receptor positive status [ER+]: Principal | ICD-10-CM

## 2017-06-01 DIAGNOSIS — C50311 Malignant neoplasm of lower-inner quadrant of right female breast: Secondary | ICD-10-CM

## 2017-06-01 DIAGNOSIS — Z510 Encounter for antineoplastic radiation therapy: Secondary | ICD-10-CM | POA: Diagnosis not present

## 2017-06-01 NOTE — Progress Notes (Signed)
  Radiation Oncology         ([phone]) [phone] ________________________________  Name: Ruth Nguyen MRN: [id number]  Date: 06/01/2017  DOB: 1971/05/20  SIMULATION AND TREATMENT PLANNING NOTE    ICD-10-CM   1. Malignant neoplasm of lower-inner quadrant of right breast of female, estrogen receptor positive ([place]) C50.311    Z17.0     DIAGNOSIS:  46 y.o. 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  NARRATIVE:  The patient was brought to the [facility].  Identity was confirmed.  All relevant records and images related to the planned course of therapy were reviewed.  The patient freely provided informed written consent to proceed with treatment after reviewing the details related to the planned course of therapy. The consent form was witnessed and verified by the simulation staff.  Then, the patient was set-up in a stable reproducible supine with right arm raised position for radiation therapy.  CT images were obtained.  Surface markings were placed.  The CT images were loaded into the planning software.  Then the target and avoidance structures were contoured.  Treatment planning then occurred.  The radiation prescription was entered and confirmed.  Then, I designed and supervised the construction of a total of 3 medically necessary complex treatment devices.  I have requested : 3D Simulation  I have requested a DVH of the following structures: Heart, lungs, lumpectomy cavity.  I have ordered:dose calc.  PLAN:  The patient will receive 50.4 Gy in 28 fractions. No boost is planned unless sub- optimal coverage on the initial tangential beams is noted near the lumpectomy cavity. -----------------------------------  Blair Promise, PhD, MD  This document serves as a record of services personally performed by Gery Pray, MD. It was created on his behalf by Reola Mosher, a trained medical scribe. The creation of this  record is based on the scribe's personal observations and the provider's statements to them. This document has been checked and approved by the attending provider.

## 2017-06-03 DIAGNOSIS — Z510 Encounter for antineoplastic radiation therapy: Secondary | ICD-10-CM | POA: Diagnosis not present

## 2017-06-08 ENCOUNTER — Ambulatory Visit (HOSPITAL_BASED_OUTPATIENT_CLINIC_OR_DEPARTMENT_OTHER): Payer: BC Managed Care – PPO | Admitting: Oncology

## 2017-06-08 ENCOUNTER — Encounter (INDEPENDENT_AMBULATORY_CARE_PROVIDER_SITE_OTHER): Payer: Self-pay

## 2017-06-08 ENCOUNTER — Ambulatory Visit
Admission: RE | Admit: 2017-06-08 | Discharge: 2017-06-08 | Disposition: A | Discharge status: Home / Self Care | Payer: BC Managed Care – PPO | Source: Ambulatory Visit | Attending: Radiation Oncology | Admitting: Radiation Oncology

## 2017-06-08 VITALS — BP 146/73 | HR 86 | Temp 98.5°F | Resp 18 | Ht 65.0 in | Wt 222.2 lb

## 2017-06-08 DIAGNOSIS — Z510 Encounter for antineoplastic radiation therapy: Secondary | ICD-10-CM | POA: Diagnosis not present

## 2017-06-08 DIAGNOSIS — C50511 Malignant neoplasm of lower-outer quadrant of right female breast: Secondary | ICD-10-CM

## 2017-06-08 DIAGNOSIS — C50311 Malignant neoplasm of lower-inner quadrant of right female breast: Secondary | ICD-10-CM

## 2017-06-08 DIAGNOSIS — Z170 Estrogen receptor positive status [ER+]: Principal | ICD-10-CM

## 2017-06-08 NOTE — Progress Notes (Signed)
  Radiation Oncology         [phone]) [phone] ________________________________  Name: Ruth Nguyen MRN: [id number]  Date: 06/08/2017  DOB: 1971-07-03  Simulation Verification Note    ICD-10-CM   1. Malignant neoplasm of lower-inner quadrant of right breast of female, estrogen receptor positive ([place]) C50.311    Z17.0     Status: outpatient  NARRATIVE: The patient was brought to the treatment unit and placed in the planned treatment position. The clinical setup was verified. Then port films were obtained and uploaded to the radiation oncology medical record software.  The treatment beams were carefully compared against the planned radiation fields. The position location and shape of the radiation fields was reviewed. They targeted volume of tissue appears to be appropriately covered by the radiation beams. Organs at risk appear to be excluded as planned.  Based on my personal review, I approved the simulation verification. The patient's treatment will proceed as planned.  -----------------------------------  Blair Promise, PhD, MD

## 2017-06-08 NOTE — Progress Notes (Signed)
[facility]  [phone] Fax:([phone]) [phone]     ID: Ruth Nguyen DOB: Nov 21, 1970  MR#: [id number]  JYN#:[id number]  Patient Care Team: Scifres, Durel Salts as PCP - General (Physician Assistant) Everlene Farrier, MD as Consulting Physician (Obstetrics and Gynecology) Excell Seltzer, MD as Consulting Physician (General Surgery) Jahon Bart, Virgie Dad, MD as Consulting Physician (Oncology) Gery Pray, MD as Consulting Physician (Radiation Oncology) Chauncey Cruel, MD OTHER MD:  CHIEF COMPLAINT: Estrogen receptor positive breast cancer  CURRENT TREATMENT: Tamoxifen; adjuvant radiation pending   BREAST CANCER HISTORY: From the original intake note:  Ruth Nguyen's college roommate recently died from stage IV breast cancer. This was a major event in Ruth Nguyen's life and it motivated her to start doing breast self exams. She also noted pain in her left breast and she brought this to medical attention. Dr. Gertie Fey set her up for bilateral diagnostic mammography with tomography and bilateral breast ultrasonography at the [facility] 03/19/2017. This showed the breast density to be category B. In the lower inner quadrant of the right breast there was an area of asymmetry which on ultrasound corresponded to an irregular hypoechoic mass at the 4:00 radiant 7 cm from the nipple measuring 0.9 cm. The right axilla was negative by ultrasonography  The left breast by contrast was negative both by mammography and ultrasonography.   Biopsy of the right breast mass in question 03/23/2017 showed ([id number]) and invasive ductal carcinoma, grade 1, estrogen receptor 95% positive, progesterone receptor 100% positive, both with strong staining intensity, with an MIB-1 of 5%, and no HER-2 amplification, the signals ratio being 1.42 and the number per cell 1.85.  The patient's subsequent history is as detailed below.  INTERVAL HISTORY: Ruth Nguyen returns today for follow-up  and treatment of her estrogen receptor positive breast cancer. Since her last visit here she had genetics testing, which showed no deleterious mutation. We also obtained an Oncotype score on the original biopsy from June and this predicted a 10 year risk of recurrence outside the breast of 9% if her only systemic therapy is tamoxifen for 5 years.  She then proceeded to right lumpectomy and sentinel lymph node sampling, on 04/21/2017. The final pathology ([facility]ZA [id number]) showed invasive ductal carcinoma, grade 1, measuring 0.9 cm, with the single sentinel lymph nodes clear. 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 This was successful (SZA [id number]).  She was then evaluated by radiation and is scheduled to complete her treatments 07/24/2017.  REVIEW OF SYSTEMS: She started tamoxifen neoadjuvantly because of concerns regarding possible surgical delays. She is tolerating it well except for hot flashes. She obtains it at a good price. Today she is stressed because it was the first day of school. 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 She had set up an alternative ride for her son, but he was not allowed to ride the right bus (the bus driver was not helpful) Ruth Nguyen to walk part of the time accompanied by teacher. This just added to her stress and she was tearful today which she tells me she has not been since the first time she was here. A detailed review of systems today was otherwise stable  PAST MEDICAL HISTORY: Past Medical History:  Diagnosis Date  . Anemia   . Anxiety   . Breast cancer ([place])   . Complication of anesthesia   . Family history of breast cancer 04/01/2017  . Migraines   .  PONV (postoperative nausea and vomiting)    pt needs scope patch    PAST SURGICAL HISTORY: Past Surgical History:  Procedure Laterality Date  . BREAST LUMPECTOMY WITH RADIOACTIVE SEED AND  SENTINEL LYMPH NODE BIOPSY Right 04/21/2017   Procedure: BREAST LUMPECTOMY WITH RADIOACTIVE SEED AND SENTINEL LYMPH NODE BIOPSY;  Surgeon: Excell Seltzer, MD;  Location: [facility];  Service: General;  Laterality: Right;  . CLOSED REDUCTION ELBOW DISLOCATION    . DILATION AND CURETTAGE OF UTERUS    . miscarriage    . RE-EXCISION OF BREAST LUMPECTOMY Right 04/29/2017   Procedure: RIGHT RE-EXCISION OF BREAST LUMPECTOMY;  Surgeon: Excell Seltzer, MD;  Location: [facility];  Service: General;  Laterality: Right;  ERAS PATHWAY  . WISDOM TOOTH EXTRACTION      FAMILY HISTORY Family History  Problem Relation Age of Onset  . Fibromyalgia Mother   . Heart failure Mother   . Mental illness Sister   . ADD / ADHD Daughter   . Asthma Son   . ADD / ADHD Son   . Osteoporosis Maternal Grandmother   . Parkinson's disease Maternal Grandfather   . Breast cancer Paternal Grandmother 44       died in 88's  . Stroke Paternal Grandfather   . Cancer Paternal Grandfather 37       abdominal cancer- stomach?Colon?  The patient's parents are both 14 years old as of June 2018. The patient has one brother, one sister. A paternal grandmother was diagnosed with breast cancer in her 52s. A paternal grandfather had "stomach" cancer at an older adult. There is no history of ovarian cancer in the family.  GYNECOLOGIC HISTORY:  Patient's last menstrual period was 05/11/2017 (approximate). Menarche age 7, first live birth age 48, she is Ruth Nguyen P2. She still has regular periods which last 5-7 days of which the first 2 days are heavy. She took oral contraceptives briefly for about a year remotely, stopped because of headaches.  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 She is divorced, at home is just she and her 2 children, Ruth Nguyen, studying English at [employer], and eighth greater, as of June 2018.  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    ADVANCED DIRECTIVES: At the 04/01/2017 visit the patient was given the appropriate documents to complete and notarize at her discretion   HEALTH MAINTENANCE: Social History  Substance Use Topics  . Smoking status: Never Smoker  . Smokeless tobacco: Never Used  . Alcohol use No     Colonoscopy:Never  PAP:  Bone density:[facility], 02/14/2013, T score -0.6   Allergies  Allergen Reactions  . Penicillins Itching    Current Outpatient Prescriptions  Medication Sig Dispense Refill  . albuterol (ACCUNEB) 0.63 MG/3ML nebulizer solution Take 1 ampule by nebulization every 6 (six) hours as needed for wheezing.    Marland Kitchen amitriptyline (ELAVIL) 25 MG tablet Take 25 mg by mouth daily.  1  . HYDROcodone-acetaminophen (NORCO) 5-325 MG tablet Take 1 tablet by mouth every 4 (four) hours as needed for moderate pain. 10 tablet 0  . tamoxifen (NOLVADEX) 20 MG tablet Take 20 mg by mouth daily.     No current facility-administered medications for this visit.     OBJECTIVE: Middle-aged white womanAppears stated age 46:   06/08/17 1521  BP: (!) 146/73  Pulse: 86  Resp: 18  Temp: 98.5 F (36.9 C)  SpO2: 99%     Body mass index is 36.98 kg/m.   Filed Weights   06/08/17 1521  Weight: 222 lb 3.2 oz (100.8 kg)       ECOG FS:0 - Asymptomatic  Sclerae unicteric, EOMs intact Oropharynx clear and moist No cervical or supraclavicular adenopathy Lungs no rales or rhonchi Heart regular rate and rhythm Abd soft, nontender, positive bowel sounds MSK no focal spinal tenderness, no upper extremity lymphedema Neuro: nonfocal, well oriented, appropriate affect Breasts: The right breast is status post lumpectomy. The cosmetic result is good. There is a palpable area superior to the scar which is likely a small seroma. There is no associated erythema. The left breast is benign. Both axillae are benign.  LAB  RESULTS:  CMP     Component Value Date/Time   NA 139 04/01/2017 0819   K 4.0 04/01/2017 0819   CO2 27 04/01/2017 0819   GLUCOSE 79 04/01/2017 0819   BUN 9.3 04/01/2017 0819   CREATININE 0.7 04/01/2017 0819   CALCIUM 9.2 04/01/2017 0819   PROT 7.6 04/01/2017 0819   ALBUMIN 3.5 04/01/2017 0819   AST 17 04/01/2017 0819   ALT 19 04/01/2017 0819   ALKPHOS 87 04/01/2017 0819   BILITOT 0.39 04/01/2017 0819    No results found for: TOTALPROTELP, ALBUMINELP, A1GS, A2GS, BETS, BETA2SER, GAMS, MSPIKE, SPEI  No results found for: [facility], [facility]  Lab Results  Component Value Date   WBC 5.8 04/01/2017   NEUTROABS 3.4 04/01/2017   HGB 13.2 04/01/2017   HCT 40.2 04/01/2017   MCV 90.1 04/01/2017   PLT 328 04/01/2017      Chemistry      Component Value Date/Time   NA 139 04/01/2017 0819   K 4.0 04/01/2017 0819   CO2 27 04/01/2017 0819   BUN 9.3 04/01/2017 0819   CREATININE 0.7 04/01/2017 0819      Component Value Date/Time   CALCIUM 9.2 04/01/2017 0819   ALKPHOS 87 04/01/2017 0819   AST 17 04/01/2017 0819   ALT 19 04/01/2017 0819   BILITOT 0.39 04/01/2017 0819       No results found for: LABCA2  No components found for: NOBSJG283  No results for input(s): INR in the last 168 hours.  Urinalysis    Component Value Date/Time   COLORURINE STRAW (A) 04/13/2017 0757   APPEARANCEUR CLEAR 04/13/2017 0757   LABSPEC 1.002 (L) 04/13/2017 0757   PHURINE 6.0 04/13/2017 0757   GLUCOSEU NEGATIVE 04/13/2017 0757   HGBUR SMALL (A) 04/13/2017 0757   BILIRUBINUR NEGATIVE 04/13/2017 0757   KETONESUR NEGATIVE 04/13/2017 0757   PROTEINUR NEGATIVE 04/13/2017 0757   NITRITE NEGATIVE 04/13/2017 0757   LEUKOCYTESUR NEGATIVE 04/13/2017 0757     STUDIES: No results found.  ELIGIBLE FOR AVAILABLE RESEARCH PROTOCOL: No  ASSESSMENT: 46 y.o. [place] woman status post right breast lower outer quadrant biopsy 03/23/2017 for a clinical T1b N0, stage IA invasive  ductal carcinoma, grade 1, estrogen and progesterone receptor positive, HER-2 nonamplified, with an MIB-1 of 5%  (1) genetics testing 04/01/2017 through the"Common Hereditary Cancer Panel",performed at [facility], found no deleterious mutations in  PC, ATM, AXIN2, BARD1, BMPR1A, BRCA1, BRCA2, BRIP1, CDH1, CDKN2A (p14ARF), CDKN2A (p16INK4a), CHEK2, CTNNA1, DICER1, EPCAM (Deletion/duplication testing only), GREM1 (promoter region deletion/duplication testing only), KIT, MEN1, MLH1, MSH2, MSH3, MSH6, MUTYH, NBN, NF1, NHTL1, PALB2, PDGFRA, PMS2, POLD1, POLE, PTEN, RAD50, RAD51C, RAD51D, SDHB, SDHC, SDHD, SMAD4, SMARCA4. STK11, TP53, TSC1, TSC2, and VHL.    (2) Oncotype  DX score of 14 predicts a 10 year outside the breast risk of recurrence of 9% if the patient's only systemic therapy is tamoxifen for 5 years. It also predicts no benefit from adjuvant chemotherapy.  (3) breast conserving surgery with sentinel lymph node proposed, depending on genetics results  (a) margins cleared with additional surgery 04/29/2017  (4) Adjuvant radiation Ongoing  (5) tamoxifen started neoadjuvantly 04/01/2017 given possibility of some surgical delays  PLAN: Ruth Nguyen was very emotional today partly because of her son schedule being messed up because of today's visit and partly because of course she remembers her close friend who died from breast cancer and that is how her own breast cancers Ruth Nguyen started.  We discussed the fact that Ruth Nguyen did very well with her surgery and hopefully we'll do equally well with her radiation. She is also tolerating tamoxifen well, with hot flashes as the main issue. The plan will be to continue that drug for a minimum of 5 years.  We reviewed the results of her Oncotype and she understands her prognosis without chemotherapy is excellent Ruth Nguyen she does take anti-estrogens for 5 years. She also understands that chemotherapy would not improve her already very good  prognosis which is the reason she did not receive it.  I have encouraged her to start a walking program so that she will not get excessively fatigued from the radiation.  Otherwise she will see me again mid to late October. By then she will be done with her radiation treatments and we can start to plan long-term follow-up  She knows to call for any other issues that may develop before her next visit.   Chauncey Cruel, MD   06/08/2017 3:33 PM Medical Oncology and Hematology [facility] [address] [place], [place] [address] Tel. [phone]    Fax. [phone]

## 2017-06-09 ENCOUNTER — Telehealth: Payer: Self-pay

## 2017-06-09 ENCOUNTER — Ambulatory Visit
Admission: RE | Admit: 2017-06-09 | Discharge: 2017-06-09 | Disposition: A | Discharge status: Home / Self Care | Payer: BC Managed Care – PPO | Source: Ambulatory Visit | Attending: Radiation Oncology | Admitting: Radiation Oncology

## 2017-06-09 DIAGNOSIS — Z170 Estrogen receptor positive status [ER+]: Principal | ICD-10-CM

## 2017-06-09 DIAGNOSIS — Z510 Encounter for antineoplastic radiation therapy: Secondary | ICD-10-CM | POA: Diagnosis not present

## 2017-06-09 DIAGNOSIS — C50311 Malignant neoplasm of lower-inner quadrant of right female breast: Secondary | ICD-10-CM

## 2017-06-09 MED ORDER — ALRA NON-METALLIC DEODORANT (RAD-ONC)
1.0000 "application " | Freq: Once | TOPICAL | Status: AC
  Administered 2017-06-09: 1 via TOPICAL

## 2017-06-09 MED ORDER — RADIAPLEXRX EX GEL
Freq: Once | CUTANEOUS | Status: AC
  Administered 2017-06-09: 17:00:00 via TOPICAL

## 2017-06-09 NOTE — Telephone Encounter (Signed)
Left a voice message concerning upcoming appointment for10/24. Per los

## 2017-06-09 NOTE — Progress Notes (Signed)
Pt here for patient teaching.  Pt given Radiation and You booklet, skin care instructions, Alra deodorant and Radiaplex gel.  Reviewed areas of pertinence such as fatigue, skin changes, breast tenderness and breast swelling . Pt able to give teach back of to pat skin and use unscented/gentle soap,apply Radiaplex bid, avoid applying anything to skin within 4 hours of treatment, avoid wearing an under wire bra and to use an electric razor if they must shave. Pt demonstrated understanding and verbalizes understanding of information given and will contact nursing with any questions or concerns.

## 2017-06-10 ENCOUNTER — Ambulatory Visit
Admission: RE | Admit: 2017-06-10 | Discharge: 2017-06-10 | Disposition: A | Discharge status: Home / Self Care | Payer: BC Managed Care – PPO | Source: Ambulatory Visit | Attending: Radiation Oncology | Admitting: Radiation Oncology

## 2017-06-10 DIAGNOSIS — Z510 Encounter for antineoplastic radiation therapy: Secondary | ICD-10-CM | POA: Diagnosis not present

## 2017-06-11 ENCOUNTER — Ambulatory Visit
Admission: RE | Admit: 2017-06-11 | Discharge: 2017-06-11 | Disposition: A | Discharge status: Home / Self Care | Payer: BC Managed Care – PPO | Source: Ambulatory Visit | Attending: Radiation Oncology | Admitting: Radiation Oncology

## 2017-06-11 DIAGNOSIS — Z510 Encounter for antineoplastic radiation therapy: Secondary | ICD-10-CM | POA: Diagnosis not present

## 2017-06-12 ENCOUNTER — Ambulatory Visit
Admission: RE | Admit: 2017-06-12 | Discharge: 2017-06-12 | Disposition: A | Discharge status: Home / Self Care | Payer: BC Managed Care – PPO | Source: Ambulatory Visit | Attending: Radiation Oncology | Admitting: Radiation Oncology

## 2017-06-12 DIAGNOSIS — Z510 Encounter for antineoplastic radiation therapy: Secondary | ICD-10-CM | POA: Diagnosis not present

## 2017-06-16 ENCOUNTER — Ambulatory Visit
Admission: RE | Admit: 2017-06-16 | Discharge: 2017-06-16 | Disposition: A | Discharge status: Home / Self Care | Payer: BC Managed Care – PPO | Source: Ambulatory Visit | Attending: Radiation Oncology | Admitting: Radiation Oncology

## 2017-06-16 DIAGNOSIS — Z510 Encounter for antineoplastic radiation therapy: Secondary | ICD-10-CM | POA: Diagnosis not present

## 2017-06-17 ENCOUNTER — Ambulatory Visit
Admission: RE | Admit: 2017-06-17 | Discharge: 2017-06-17 | Disposition: A | Discharge status: Home / Self Care | Payer: BC Managed Care – PPO | Source: Ambulatory Visit | Attending: Radiation Oncology | Admitting: Radiation Oncology

## 2017-06-17 DIAGNOSIS — Z510 Encounter for antineoplastic radiation therapy: Secondary | ICD-10-CM | POA: Diagnosis not present

## 2017-06-18 ENCOUNTER — Ambulatory Visit
Admission: RE | Admit: 2017-06-18 | Discharge: 2017-06-18 | Disposition: A | Discharge status: Home / Self Care | Payer: BC Managed Care – PPO | Source: Ambulatory Visit | Attending: Radiation Oncology | Admitting: Radiation Oncology

## 2017-06-18 DIAGNOSIS — Z510 Encounter for antineoplastic radiation therapy: Secondary | ICD-10-CM | POA: Diagnosis not present

## 2017-06-19 ENCOUNTER — Ambulatory Visit
Admission: RE | Admit: 2017-06-19 | Discharge: 2017-06-19 | Disposition: A | Discharge status: Home / Self Care | Payer: BC Managed Care – PPO | Source: Ambulatory Visit | Attending: Radiation Oncology | Admitting: Radiation Oncology

## 2017-06-19 DIAGNOSIS — Z510 Encounter for antineoplastic radiation therapy: Secondary | ICD-10-CM | POA: Diagnosis not present

## 2017-06-22 ENCOUNTER — Ambulatory Visit
Admission: RE | Admit: 2017-06-22 | Discharge: 2017-06-22 | Disposition: A | Discharge status: Home / Self Care | Payer: BC Managed Care – PPO | Source: Ambulatory Visit | Attending: Radiation Oncology | Admitting: Radiation Oncology

## 2017-06-22 DIAGNOSIS — Z510 Encounter for antineoplastic radiation therapy: Secondary | ICD-10-CM | POA: Diagnosis not present

## 2017-06-23 ENCOUNTER — Ambulatory Visit
Admission: RE | Admit: 2017-06-23 | Discharge: 2017-06-23 | Disposition: A | Discharge status: Home / Self Care | Payer: BC Managed Care – PPO | Source: Ambulatory Visit | Attending: Radiation Oncology | Admitting: Radiation Oncology

## 2017-06-23 DIAGNOSIS — Z510 Encounter for antineoplastic radiation therapy: Secondary | ICD-10-CM | POA: Diagnosis not present

## 2017-06-23 MED ORDER — SONAFINE EX EMUL
1.0000 "application " | Freq: Once | CUTANEOUS | Status: AC
  Administered 2017-06-24: 1 via TOPICAL

## 2017-06-24 ENCOUNTER — Ambulatory Visit
Admission: RE | Admit: 2017-06-24 | Discharge: 2017-06-24 | Disposition: A | Discharge status: Home / Self Care | Payer: BC Managed Care – PPO | Source: Ambulatory Visit | Attending: Radiation Oncology | Admitting: Radiation Oncology

## 2017-06-24 DIAGNOSIS — Z170 Estrogen receptor positive status [ER+]: Principal | ICD-10-CM

## 2017-06-24 DIAGNOSIS — Z510 Encounter for antineoplastic radiation therapy: Secondary | ICD-10-CM | POA: Diagnosis not present

## 2017-06-24 DIAGNOSIS — C50311 Malignant neoplasm of lower-inner quadrant of right female breast: Secondary | ICD-10-CM

## 2017-06-25 ENCOUNTER — Ambulatory Visit
Admission: RE | Admit: 2017-06-25 | Discharge: 2017-06-25 | Disposition: A | Discharge status: Home / Self Care | Payer: BC Managed Care – PPO | Source: Ambulatory Visit | Attending: Radiation Oncology | Admitting: Radiation Oncology

## 2017-06-25 DIAGNOSIS — Z510 Encounter for antineoplastic radiation therapy: Secondary | ICD-10-CM | POA: Diagnosis not present

## 2017-06-26 ENCOUNTER — Ambulatory Visit
Admission: RE | Admit: 2017-06-26 | Discharge: 2017-06-26 | Disposition: A | Discharge status: Home / Self Care | Payer: BC Managed Care – PPO | Source: Ambulatory Visit | Attending: Radiation Oncology | Admitting: Radiation Oncology

## 2017-06-26 DIAGNOSIS — Z510 Encounter for antineoplastic radiation therapy: Secondary | ICD-10-CM | POA: Diagnosis not present

## 2017-06-29 ENCOUNTER — Ambulatory Visit
Admission: RE | Admit: 2017-06-29 | Discharge: 2017-06-29 | Disposition: A | Discharge status: Home / Self Care | Payer: BC Managed Care – PPO | Source: Ambulatory Visit | Attending: Radiation Oncology | Admitting: Radiation Oncology

## 2017-06-29 DIAGNOSIS — Z510 Encounter for antineoplastic radiation therapy: Secondary | ICD-10-CM | POA: Diagnosis not present

## 2017-06-30 ENCOUNTER — Ambulatory Visit
Admission: RE | Admit: 2017-06-30 | Discharge: 2017-06-30 | Disposition: A | Discharge status: Home / Self Care | Payer: BC Managed Care – PPO | Source: Ambulatory Visit | Attending: Radiation Oncology | Admitting: Radiation Oncology

## 2017-06-30 DIAGNOSIS — Z170 Estrogen receptor positive status [ER+]: Secondary | ICD-10-CM | POA: Insufficient documentation

## 2017-06-30 DIAGNOSIS — C50311 Malignant neoplasm of lower-inner quadrant of right female breast: Secondary | ICD-10-CM | POA: Insufficient documentation

## 2017-06-30 DIAGNOSIS — Z510 Encounter for antineoplastic radiation therapy: Secondary | ICD-10-CM | POA: Diagnosis not present

## 2017-07-01 ENCOUNTER — Ambulatory Visit
Admission: RE | Admit: 2017-07-01 | Discharge: 2017-07-01 | Disposition: A | Discharge status: Home / Self Care | Payer: BC Managed Care – PPO | Source: Ambulatory Visit | Attending: Radiation Oncology | Admitting: Radiation Oncology

## 2017-07-01 DIAGNOSIS — Z510 Encounter for antineoplastic radiation therapy: Secondary | ICD-10-CM | POA: Diagnosis not present

## 2017-07-02 ENCOUNTER — Ambulatory Visit
Admission: RE | Admit: 2017-07-02 | Discharge: 2017-07-02 | Disposition: A | Discharge status: Home / Self Care | Payer: BC Managed Care – PPO | Source: Ambulatory Visit | Attending: Radiation Oncology | Admitting: Radiation Oncology

## 2017-07-02 DIAGNOSIS — Z510 Encounter for antineoplastic radiation therapy: Secondary | ICD-10-CM | POA: Diagnosis not present

## 2017-07-03 ENCOUNTER — Ambulatory Visit
Admission: RE | Admit: 2017-07-03 | Discharge: 2017-07-03 | Disposition: A | Discharge status: Home / Self Care | Payer: BC Managed Care – PPO | Source: Ambulatory Visit | Attending: Radiation Oncology | Admitting: Radiation Oncology

## 2017-07-03 DIAGNOSIS — Z510 Encounter for antineoplastic radiation therapy: Secondary | ICD-10-CM | POA: Diagnosis not present

## 2017-07-06 ENCOUNTER — Ambulatory Visit: Payer: BC Managed Care – PPO

## 2017-07-06 ENCOUNTER — Ambulatory Visit
Admission: RE | Admit: 2017-07-06 | Discharge: 2017-07-06 | Disposition: A | Discharge status: Home / Self Care | Payer: BC Managed Care – PPO | Source: Ambulatory Visit | Attending: Radiation Oncology | Admitting: Radiation Oncology

## 2017-07-06 DIAGNOSIS — Z510 Encounter for antineoplastic radiation therapy: Secondary | ICD-10-CM | POA: Diagnosis not present

## 2017-07-07 ENCOUNTER — Ambulatory Visit: Payer: BC Managed Care – PPO

## 2017-07-07 ENCOUNTER — Telehealth: Payer: Self-pay | Admitting: Oncology

## 2017-07-07 NOTE — Telephone Encounter (Signed)
Left a message for patient regarding her radiation treatment tomorrow.  Advised her to come in after her appointment with Dr. Excell Seltzer and she will be worked in.

## 2017-07-08 ENCOUNTER — Ambulatory Visit
Admission: RE | Admit: 2017-07-08 | Discharge: 2017-07-08 | Disposition: A | Discharge status: Home / Self Care | Payer: BC Managed Care – PPO | Source: Ambulatory Visit | Attending: Radiation Oncology | Admitting: Radiation Oncology

## 2017-07-08 DIAGNOSIS — Z510 Encounter for antineoplastic radiation therapy: Secondary | ICD-10-CM | POA: Diagnosis not present

## 2017-07-09 ENCOUNTER — Ambulatory Visit
Admission: RE | Admit: 2017-07-09 | Discharge: 2017-07-09 | Disposition: A | Discharge status: Home / Self Care | Payer: BC Managed Care – PPO | Source: Ambulatory Visit | Attending: Radiation Oncology | Admitting: Radiation Oncology

## 2017-07-09 ENCOUNTER — Other Ambulatory Visit: Payer: Self-pay | Admitting: General Surgery

## 2017-07-09 DIAGNOSIS — M7989 Other specified soft tissue disorders: Secondary | ICD-10-CM

## 2017-07-09 DIAGNOSIS — Z510 Encounter for antineoplastic radiation therapy: Secondary | ICD-10-CM | POA: Diagnosis not present

## 2017-07-10 ENCOUNTER — Ambulatory Visit
Admission: RE | Admit: 2017-07-10 | Discharge: 2017-07-10 | Disposition: A | Discharge status: Home / Self Care | Payer: BC Managed Care – PPO | Source: Ambulatory Visit | Attending: Radiation Oncology | Admitting: Radiation Oncology

## 2017-07-10 DIAGNOSIS — C50311 Malignant neoplasm of lower-inner quadrant of right female breast: Secondary | ICD-10-CM

## 2017-07-10 DIAGNOSIS — Z170 Estrogen receptor positive status [ER+]: Principal | ICD-10-CM

## 2017-07-10 DIAGNOSIS — Z510 Encounter for antineoplastic radiation therapy: Secondary | ICD-10-CM | POA: Diagnosis not present

## 2017-07-10 MED ORDER — SONAFINE EX EMUL
1.0000 "application " | Freq: Once | CUTANEOUS | Status: AC
  Administered 2017-07-10: 1 via TOPICAL

## 2017-07-13 ENCOUNTER — Ambulatory Visit
Admission: RE | Admit: 2017-07-13 | Discharge: 2017-07-13 | Disposition: A | Discharge status: Home / Self Care | Payer: BC Managed Care – PPO | Source: Ambulatory Visit | Attending: Radiation Oncology | Admitting: Radiation Oncology

## 2017-07-13 DIAGNOSIS — Z510 Encounter for antineoplastic radiation therapy: Secondary | ICD-10-CM | POA: Diagnosis not present

## 2017-07-14 ENCOUNTER — Ambulatory Visit
Admission: RE | Admit: 2017-07-14 | Discharge: 2017-07-14 | Disposition: A | Discharge status: Home / Self Care | Payer: BC Managed Care – PPO | Source: Ambulatory Visit | Attending: Radiation Oncology | Admitting: Radiation Oncology

## 2017-07-14 ENCOUNTER — Other Ambulatory Visit: Payer: Self-pay | Admitting: Radiation Oncology

## 2017-07-14 DIAGNOSIS — Z510 Encounter for antineoplastic radiation therapy: Secondary | ICD-10-CM | POA: Diagnosis not present

## 2017-07-14 DIAGNOSIS — Z170 Estrogen receptor positive status [ER+]: Principal | ICD-10-CM

## 2017-07-14 DIAGNOSIS — C50311 Malignant neoplasm of lower-inner quadrant of right female breast: Secondary | ICD-10-CM

## 2017-07-14 MED ORDER — FLUCONAZOLE 150 MG PO TABS: 150.0000 mg | ORAL_TABLET | Freq: Every day | ORAL | 0 refills | Status: DC

## 2017-07-14 NOTE — Progress Notes (Addendum)
Patient Name: DELCIA, Ruth Nguyen  MRN: [id number] DOB: [birth year] Date: 2017-07-14  Verification Simulation and Treatment Planning Note: Electron Diagnosis: C50.311 Malignant neoplasm of lower-inner quadrant of right female breast The patient was escorted to the [facility] and placed in the electron treatment position. The electron field position was confirmed clinically The electron field block design was clinically reviewed. SSD's were checked. Field and set up were approved for treatment. Monitor units will be calculated based on depth and verified.

## 2017-07-15 ENCOUNTER — Ambulatory Visit
Admission: RE | Admit: 2017-07-15 | Discharge: 2017-07-15 | Disposition: A | Discharge status: Home / Self Care | Payer: BC Managed Care – PPO | Source: Ambulatory Visit | Attending: Radiation Oncology | Admitting: Radiation Oncology

## 2017-07-15 DIAGNOSIS — Z510 Encounter for antineoplastic radiation therapy: Secondary | ICD-10-CM | POA: Diagnosis not present

## 2017-07-16 ENCOUNTER — Ambulatory Visit
Admission: RE | Admit: 2017-07-16 | Discharge: 2017-07-16 | Disposition: A | Discharge status: Home / Self Care | Payer: BC Managed Care – PPO | Source: Ambulatory Visit | Attending: Radiation Oncology | Admitting: Radiation Oncology

## 2017-07-16 DIAGNOSIS — Z510 Encounter for antineoplastic radiation therapy: Secondary | ICD-10-CM | POA: Diagnosis not present

## 2017-07-17 ENCOUNTER — Ambulatory Visit
Admission: RE | Admit: 2017-07-17 | Discharge: 2017-07-17 | Disposition: A | Discharge status: Home / Self Care | Payer: BC Managed Care – PPO | Source: Ambulatory Visit | Attending: Radiation Oncology | Admitting: Radiation Oncology

## 2017-07-17 DIAGNOSIS — Z510 Encounter for antineoplastic radiation therapy: Secondary | ICD-10-CM | POA: Diagnosis not present

## 2017-07-20 ENCOUNTER — Ambulatory Visit
Admission: RE | Admit: 2017-07-20 | Discharge: 2017-07-20 | Disposition: A | Discharge status: Home / Self Care | Payer: BC Managed Care – PPO | Source: Ambulatory Visit | Attending: Radiation Oncology | Admitting: Radiation Oncology

## 2017-07-20 ENCOUNTER — Ambulatory Visit: Payer: BC Managed Care – PPO

## 2017-07-20 DIAGNOSIS — Z510 Encounter for antineoplastic radiation therapy: Secondary | ICD-10-CM | POA: Diagnosis not present

## 2017-07-21 ENCOUNTER — Ambulatory Visit: Payer: BC Managed Care – PPO

## 2017-07-21 ENCOUNTER — Ambulatory Visit
Admission: RE | Admit: 2017-07-21 | Discharge: 2017-07-21 | Disposition: A | Discharge status: Home / Self Care | Payer: BC Managed Care – PPO | Source: Ambulatory Visit | Attending: General Surgery | Admitting: General Surgery

## 2017-07-21 ENCOUNTER — Ambulatory Visit
Admission: RE | Admit: 2017-07-21 | Discharge: 2017-07-21 | Disposition: A | Discharge status: Home / Self Care | Payer: BC Managed Care – PPO | Source: Ambulatory Visit | Attending: Radiation Oncology | Admitting: Radiation Oncology

## 2017-07-21 DIAGNOSIS — Z170 Estrogen receptor positive status [ER+]: Principal | ICD-10-CM

## 2017-07-21 DIAGNOSIS — C50311 Malignant neoplasm of lower-inner quadrant of right female breast: Secondary | ICD-10-CM

## 2017-07-21 DIAGNOSIS — Z510 Encounter for antineoplastic radiation therapy: Secondary | ICD-10-CM | POA: Diagnosis not present

## 2017-07-21 DIAGNOSIS — M7989 Other specified soft tissue disorders: Secondary | ICD-10-CM

## 2017-07-21 MED ORDER — SONAFINE EX EMUL
1.0000 "application " | Freq: Once | CUTANEOUS | Status: AC
  Administered 2017-07-21: 1 via TOPICAL

## 2017-07-22 ENCOUNTER — Ambulatory Visit
Admission: RE | Admit: 2017-07-22 | Discharge: 2017-07-22 | Disposition: A | Discharge status: Home / Self Care | Payer: BC Managed Care – PPO | Source: Ambulatory Visit | Attending: Radiation Oncology | Admitting: Radiation Oncology

## 2017-07-22 ENCOUNTER — Ambulatory Visit: Payer: BC Managed Care – PPO

## 2017-07-22 ENCOUNTER — Telehealth: Payer: Self-pay

## 2017-07-22 DIAGNOSIS — Z510 Encounter for antineoplastic radiation therapy: Secondary | ICD-10-CM | POA: Diagnosis not present

## 2017-07-22 NOTE — Telephone Encounter (Signed)
Pt called asking if it was OK to get flu shot. She is finishing up XRT. S/w Santiago Glad RN and she said Dr Sondra Come is OK with pt getting flu shot.

## 2017-07-23 ENCOUNTER — Ambulatory Visit: Payer: BC Managed Care – PPO

## 2017-07-23 ENCOUNTER — Ambulatory Visit
Admission: RE | Admit: 2017-07-23 | Discharge: 2017-07-23 | Disposition: A | Discharge status: Home / Self Care | Payer: BC Managed Care – PPO | Source: Ambulatory Visit | Attending: Radiation Oncology | Admitting: Radiation Oncology

## 2017-07-23 DIAGNOSIS — Z510 Encounter for antineoplastic radiation therapy: Secondary | ICD-10-CM | POA: Diagnosis not present

## 2017-07-24 ENCOUNTER — Ambulatory Visit
Admission: RE | Admit: 2017-07-24 | Discharge: 2017-07-24 | Disposition: A | Discharge status: Home / Self Care | Payer: BC Managed Care – PPO | Source: Ambulatory Visit | Attending: Radiation Oncology | Admitting: Radiation Oncology

## 2017-07-24 ENCOUNTER — Ambulatory Visit: Payer: BC Managed Care – PPO

## 2017-07-24 DIAGNOSIS — Z510 Encounter for antineoplastic radiation therapy: Secondary | ICD-10-CM | POA: Diagnosis not present

## 2017-07-27 ENCOUNTER — Ambulatory Visit
Admission: RE | Admit: 2017-07-27 | Discharge: 2017-07-27 | Disposition: A | Discharge status: Home / Self Care | Payer: BC Managed Care – PPO | Source: Ambulatory Visit | Attending: Radiation Oncology | Admitting: Radiation Oncology

## 2017-07-27 ENCOUNTER — Ambulatory Visit: Payer: BC Managed Care – PPO

## 2017-07-27 ENCOUNTER — Encounter: Payer: Self-pay | Admitting: Radiation Oncology

## 2017-07-27 DIAGNOSIS — Z510 Encounter for antineoplastic radiation therapy: Secondary | ICD-10-CM | POA: Diagnosis not present

## 2017-07-28 ENCOUNTER — Ambulatory Visit: Payer: BC Managed Care – PPO

## 2017-07-28 ENCOUNTER — Telehealth: Payer: Self-pay | Admitting: *Deleted

## 2017-07-28 NOTE — Telephone Encounter (Signed)
CALLED PATIENT TO INFORM OF FU WITH DR. KINARD ON 08/27/17 @ 4:20 PM, LVM FOR A RETURN CALL

## 2017-07-29 ENCOUNTER — Ambulatory Visit: Payer: BC Managed Care – PPO

## 2017-07-29 NOTE — Progress Notes (Signed)
  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     Indication for treatment:  Curative.       Radiation treatment dates:   06/09/2017 to 07/27/2017  Site/dose:    1. The Right breast was treated to 50.4 Gy in 28 fractions of 1.8 Gy per fraction. 2. The Right breast was boosted to 10 Gy in 5 fractions of 2 Gy.   Beams/energy:    1. 3D // 6X 2. Electrons // 15E  Narrative: The patient tolerated radiation treatment relatively well.   The patient developed some erythema and hyperpigmentation changes but, no skin breakdown of the right breast. Denies fatigue. Using Sonafine bid.   Plan: The patient has completed radiation treatment. The patient will return to radiation oncology clinic for routine followup in one month. I advised them to call or return sooner if they have any questions or concerns related to their recovery or treatment.  -----------------------------------  Blair Promise, PhD, MD  This document serves as a record of services personally performed by Gery Pray, MD. It was created on his behalf by Arlyce Harman, a trained medical scribe. The creation of this record is based on the scribe's personal observations and the provider's statements to them. This document has been checked and approved by the attending provider.

## 2017-08-03 NOTE — Progress Notes (Signed)
[facility]  Telephone:([phone] [phone]     ID: Ruth Nguyen DOB: [birth year]  MR#: [id number]  XTG#:[id number]  Patient Care Team: Scifres, Durel Salts as PCP - General (Physician Assistant) Everlene Farrier, MD as Consulting Physician (Obstetrics and Gynecology) Excell Seltzer, MD as Consulting Physician (General Surgery) Gust Eugene, Virgie Dad, MD as Consulting Physician (Oncology) Gery Pray, MD as Consulting Physician (Radiation Oncology) OTHER MD:  CHIEF COMPLAINT: Estrogen receptor positive breast cancer  CURRENT TREATMENT: Tamoxifen; adjuvant radiation pending   BREAST CANCER HISTORY: From the original intake note:  Ruth Nguyen's college roommate recently died from stage IV breast cancer. This was a major event in Ruth Nguyen's life and it motivated her to start doing breast self exams. She also noted pain in her left breast and she brought this to medical attention. Dr. Gertie Fey set her up for bilateral diagnostic mammography with tomography and bilateral breast ultrasonography at the [facility] 03/19/2017. This showed the breast density to be category B. In the lower inner quadrant of the right breast there was an area of asymmetry which on ultrasound corresponded to an irregular hypoechoic mass at the 4:00 radiant 7 cm from the nipple measuring 0.9 cm. The right axilla was negative by ultrasonography  The left breast by contrast was negative both by mammography and ultrasonography.   Biopsy of the right breast mass in question 03/23/2017 showed ([id number]) and invasive ductal carcinoma, grade 1, estrogen receptor 95% positive, progesterone receptor 100% positive, both with strong staining intensity, with an MIB-1 of 5%, and no HER-2 amplification, the signals ratio being 1.42 and the number per cell 1.85.  The patient's subsequent history is as detailed below.  INTERVAL HISTORY: Ruth Nguyen  returns today for follow-up and treatment of her  estrogen receptor positive breast cancer. She continues on tamoxifen, with overall good tolerance. She has occasional hot flashes with a couple episodes a week. She denies increased vaginal wetness.  Since her last visit to the office, she has underwent a right breast US on 07/21/2017 completed at [facility] with results showing: postoperative seroma. She completed her radiation therapy with Dr. Sondra Come. She underwent radiation therapy from 06/09/2017-07/24/2017 to her right breast at a dose of 50.4 Gy in 28 fractions of 1.8 Gy per fraction. She also had a boost to her right breast at a dose of 10 Gy in 5 fractions of 2 Gy.   She had a seroma that developed and was inflamed during her radiotherapy. She was treated with 2 rounds of doxycycline that fully alleviated her symptoms. She had 2 weeks of fatigue and low-grade fever during her treatment. She was last evaluated by her Surgeon this week. She only missed 1 day of work during her radiation treatments.    REVIEW OF SYSTEMS: Ruth Nguyen reports that she does Yoga 5 days of the week as her form of exercise. She had "heat-stroke" symptoms of feeling dizzy and weak while out at church. She has had two occurrences of this while at church. On last Saturday she had a reunion of [employer] in [place]. This weekend will be her 30th high school reunion, which she is excited for. She reports that since backing away from watching her 10 year old son complete his school work, his school work efforts and grades have declined. He is in [employer] classes is an IEP Ship broker. She notes mild guilt for letting this occur and notes that she will watch and aid the patient with his school work from here  on out. She denies unusual headaches, visual changes, nausea, vomiting, or dizziness. There has been no unusual cough, phlegm production, or pleurisy. This been no change in bowel or bladder habits. She denies unexplained fatigue or unexplained weight loss,  bleeding, rash, or fever. A detailed review of systems was otherwise stable.    PAST MEDICAL HISTORY: Past Medical History:  Diagnosis Date  . Anemia   . Anxiety   . Breast cancer ([place])   . Complication of anesthesia   . Family history of breast cancer 04/01/2017  . Migraines   . PONV (postoperative nausea and vomiting)    pt needs scope patch    PAST SURGICAL HISTORY: Past Surgical History:  Procedure Laterality Date  . BREAST LUMPECTOMY WITH RADIOACTIVE SEED AND SENTINEL LYMPH NODE BIOPSY Right 04/21/2017   Procedure: BREAST LUMPECTOMY WITH RADIOACTIVE SEED AND SENTINEL LYMPH NODE BIOPSY;  Surgeon: Excell Seltzer, MD;  Location: [facility];  Service: General;  Laterality: Right;  . CLOSED REDUCTION ELBOW DISLOCATION    . DILATION AND CURETTAGE OF UTERUS    . miscarriage    . RE-EXCISION OF BREAST LUMPECTOMY Right 04/29/2017   Procedure: RIGHT RE-EXCISION OF BREAST LUMPECTOMY;  Surgeon: Excell Seltzer, MD;  Location: [facility];  Service: General;  Laterality: Right;  ERAS PATHWAY  . WISDOM TOOTH EXTRACTION      FAMILY HISTORY Family History  Problem Relation Age of Onset  . Fibromyalgia Mother   . Heart failure Mother   . Mental illness Sister   . ADD / ADHD Daughter   . Asthma Son   . ADD / ADHD Son   . Osteoporosis Maternal Grandmother   . Parkinson's disease Maternal Grandfather   . Breast cancer Paternal Grandmother 13       died in 38's  . Stroke Paternal Grandfather   . Cancer Paternal Grandfather 7       abdominal cancer- stomach?Colon?  The patient's parents are both 75 years old as of June 2018. The patient has one brother, one sister. A paternal grandmother was diagnosed with breast cancer in her 66s. A paternal grandfather had "stomach" cancer at an older adult. There is no history of ovarian cancer in the family.  GYNECOLOGIC HISTORY:  No LMP recorded. Menarche age 89, first live birth age 53, she is [place] P2. She still  has regular periods which last 5-7 days of which the first 2 days are heavy. She took oral contraceptives briefly for about a year remotely, stopped because of headaches.  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 She is divorced, at home is just she and her 2 children, Ruth Nguyen, studying English at [employer], and eighth greater, as of June 2018. 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    ADVANCED DIRECTIVES: At the 04/01/2017 visit the patient was given the appropriate documents to complete and notarize at her discretion   HEALTH MAINTENANCE: Social History  Substance Use Topics  . Smoking status: Never Smoker  . Smokeless tobacco: Never Used  . Alcohol use No     Colonoscopy:Never  PAP:  Bone density:[facility], 02/14/2013, T score -0.6   Allergies  Allergen Reactions  . Penicillins Itching    Current Outpatient Prescriptions  Medication Sig Dispense Refill  . albuterol (ACCUNEB) 0.63 MG/3ML nebulizer solution Take 1 ampule by nebulization every 6 (six) hours as needed for wheezing.    Marland Kitchen  amitriptyline (ELAVIL) 25 MG tablet Take 25 mg by mouth daily.  1  . fluconazole (DIFLUCAN) 150 MG tablet Take 1 tablet (150 mg total) by mouth daily. Takes second tablet 72 hours later 2 tablet 0  . hyaluronate sodium (RADIAPLEXRX) GEL Apply 1 application topically once.    Marland Kitchen HYDROcodone-acetaminophen (NORCO) 5-325 MG tablet Take 1 tablet by mouth every 4 (four) hours as needed for moderate pain. 10 tablet 0  . non-metallic deodorant (ALRA) MISC Apply 1 application topically daily as needed.    . tamoxifen (NOLVADEX) 20 MG tablet Take 20 mg by mouth daily.     No current facility-administered medications for this visit.     OBJECTIVE: Middle-aged white woman who appears stated age 35:   08/05/17 1610  BP: 124/74  Pulse: 82  Resp: 18  Temp: 98.4 F (36.9 C)    SpO2: 100%     Body mass index is 36.29 kg/m.   Filed Weights   08/05/17 1610  Weight: 218 lb 1.6 oz (98.9 kg)       ECOG FS:1 - Symptomatic but completely ambulatory  Sclerae unicteric, pupils round and equal Oropharynx clear and moist No cervical or supraclavicular adenopathy Lungs no rales or rhonchi Heart regular rate and rhythm Abd soft, nontender, positive bowel sounds MSK no focal spinal tenderness, no upper extremity lymphedema Neuro: nonfocal, well oriented, appropriate affect Breasts: The right breast is status post lumpectomy followed by radiation. There is the expected coarsening of the skin. There is some residual erythema. Overall though the cosmetic result is good. The seroma deep in the right axilla is barely palpable The left breast is unremarkable. Both axillae are otherwise benign.  LAB RESULTS:  CMP     Component Value Date/Time   NA 139 04/01/2017 0819   K 4.0 04/01/2017 0819   CO2 27 04/01/2017 0819   GLUCOSE 79 04/01/2017 0819   BUN 9.3 04/01/2017 0819   CREATININE 0.7 04/01/2017 0819   CALCIUM 9.2 04/01/2017 0819   PROT 7.6 04/01/2017 0819   ALBUMIN 3.5 04/01/2017 0819   AST 17 04/01/2017 0819   ALT 19 04/01/2017 0819   ALKPHOS 87 04/01/2017 0819   BILITOT 0.39 04/01/2017 0819    No results found for: TOTALPROTELP, ALBUMINELP, A1GS, A2GS, BETS, BETA2SER, GAMS, MSPIKE, SPEI  No results found for: Nils Pyle, [facility]  Lab Results  Component Value Date   WBC 5.6 08/05/2017   NEUTROABS 3.5 08/05/2017   HGB 12.5 08/05/2017   HCT 37.8 08/05/2017   MCV 89.9 08/05/2017   PLT 283 08/05/2017      Chemistry      Component Value Date/Time   NA 139 04/01/2017 0819   K 4.0 04/01/2017 0819   CO2 27 04/01/2017 0819   BUN 9.3 04/01/2017 0819   CREATININE 0.7 04/01/2017 0819      Component Value Date/Time   CALCIUM 9.2 04/01/2017 0819   ALKPHOS 87 04/01/2017 0819   AST 17 04/01/2017 0819   ALT 19 04/01/2017 0819   BILITOT  0.39 04/01/2017 0819       No results found for: LABCA2  No components found for: [id number]  No results for input(s): INR in the last 168 hours.  Urinalysis    Component Value Date/Time   COLORURINE STRAW (A) 04/13/2017 0757   APPEARANCEUR CLEAR 04/13/2017 0757   LABSPEC 1.002 (L) 04/13/2017 0757   PHURINE 6.0 04/13/2017 0757   GLUCOSEU NEGATIVE 04/13/2017 0757   HGBUR SMALL (A) 04/13/2017 0757   BILIRUBINUR  NEGATIVE 04/13/2017 [place] 04/13/2017 0757   PROTEINUR NEGATIVE 04/13/2017 0757   NITRITE NEGATIVE 04/13/2017 0757   LEUKOCYTESUR NEGATIVE 04/13/2017 0757     STUDIES: US Breast Complete Uni Right Inc Axilla  Result Date: 07/21/2017 CLINICAL DATA:  46 year old female status post right lumpectomy and axillary lymph node removal in July 2018. The patient is currently undergoing radiation therapy. She reports painful, red and warm right axillary lump which developed 2 weeks ago. The patient completed a course of antibiotics with marked interval improvement in her clinical symptoms over the last 7-10 days. Current residual symptoms include a residual lump/ridge without painful or inflammatory symptoms. EXAM: ULTRASOUND OF THE RIGHT AXILLA COMPARISON:  Previous exam(s). FINDINGS: On physical exam, there is diffuse redness and edema of the right breast consistent with radiation related changes. No additional focal skin changes are noted in the right axilla. A soft mass is identified in the deep axillary tissues. Targeted ultrasound is performed, showing a circumscribed anechoic mass in the right axilla. It measures 3.1 x 0.2 x 0.2 cm. The collection demonstrates a thin wall without significant peripheral vascularity or inflammatory changes. IMPRESSION: Right axillary fluid collection with imaging characteristics most consistent with a postoperative seroma. Superinfection of this collection is difficult to exclude with imaging only, but is felt unlikely given the  patient's marked interval improvement/resolution of symptoms. Image guided aspiration can be performed as clinically indicated. The findings were discussed with Dr. Gery Pray on 07/21/2017 at 1:30 p.m. RECOMMENDATION: Continued clinical management and treatment. Image guided aspiration of the right axillary fluid collection can be performed as clinically indicated. I have discussed the findings and recommendations with the patient. Results were also provided in writing at the conclusion of the visit. If applicable, a reminder letter will be sent to the patient regarding the next appointment. BI-RADS CATEGORY  2: Benign. Electronically Signed   By: Kristopher Oppenheim M.D.   On: 07/21/2017 16:41    ELIGIBLE FOR AVAILABLE RESEARCH PROTOCOL: No  ASSESSMENT: 46 y.o. [place] woman status post right breast lower outer quadrant biopsy 03/23/2017 for a clinical T1b N0, stage IA invasive ductal carcinoma, grade 1, estrogen and progesterone receptor positive, HER-2 nonamplified, with an MIB-1 of 5%  (1) genetics testing 04/01/2017 through the"Common Hereditary Cancer Panel",performed at [facility], found no deleterious mutations in  PC, ATM, AXIN2, BARD1, BMPR1A, BRCA1, BRCA2, BRIP1, CDH1, CDKN2A (p14ARF), CDKN2A (p16INK4a), CHEK2, CTNNA1, DICER1, EPCAM (Deletion/duplication testing only), GREM1 (promoter region deletion/duplication testing only), KIT, MEN1, MLH1, MSH2, MSH3, MSH6, MUTYH, NBN, NF1, NHTL1, PALB2, PDGFRA, PMS2, POLD1, POLE, PTEN, RAD50, RAD51C, RAD51D, SDHB, SDHC, SDHD, SMAD4, SMARCA4. STK11, TP53, TSC1, TSC2, and VHL.    (2) Oncotype DX score of 14 predicts a 10 year outside the breast risk of recurrence of 9% if the patient's only systemic therapy is tamoxifen for 5 years. It also predicts no benefit from adjuvant chemotherapy.  (3) breast conserving surgery with sentinel lymph node proposed, depending on genetics results  (a) margins cleared with additional surgery  04/29/2017  (4) Adjuvant radiation 06/09/2017 to 07/27/2017 Site/dose:    1. The Right breast was treated to 50.4 Gy in 28 fractions of 1.8 Gy per fraction. 2. The Right breast was boosted to 10 Gy in 5 fractions of 2 Gy.   (5) tamoxifen started neoadjuvantly 04/01/2017 continued through surgery and radiation  PLAN: Paralee is now done with local treatment for her breast cancer. She did very well with both surgery and radiation. We did  discuss the expected changes in the right breast and she knows some of these may be permanent.  She may expect some sensitivity, soreness, and "shooting pains" to occur, and if they do, she understands these are generally benign  We again discussed the fact that tamoxifen is not a contraceptive. I suggested she consider either a copper IUD or Mirena IUD. She can discuss this with her gynecologist as needed.  Otherwise she will see Dr. Sondra Come in 3 weeks, she will see me again in 3 months, and she will see her surgeon Dr. Excell Seltzer in 6 months. We will continue to see her at three-month intervals the first year and then we will extend the follow-up interval  She has greatly enjoyed visiting the healing gardening, and has taken some photos which I hope to be able to see at some point.  She knows to call for any problems that may develop before the next visit.  Liza Czerwinski, Virgie Dad, MD  08/05/17 4:24 PM Medical Oncology and Hematology [facility] [address] [place], [place] [address] Tel. [phone]    Fax. [phone]  This document serves as a record of services personally performed by Lurline Del, MD. It was created on her behalf by Steva Colder, a trained medical scribe. The creation of this record is based on the scribe's personal observations and the provider's statements to them. This document has been checked and approved by the attending provider.

## 2017-08-04 ENCOUNTER — Other Ambulatory Visit: Payer: Self-pay | Admitting: *Deleted

## 2017-08-04 DIAGNOSIS — Z170 Estrogen receptor positive status [ER+]: Principal | ICD-10-CM

## 2017-08-04 DIAGNOSIS — C50311 Malignant neoplasm of lower-inner quadrant of right female breast: Secondary | ICD-10-CM

## 2017-08-05 ENCOUNTER — Other Ambulatory Visit (HOSPITAL_BASED_OUTPATIENT_CLINIC_OR_DEPARTMENT_OTHER): Payer: BC Managed Care – PPO

## 2017-08-05 ENCOUNTER — Ambulatory Visit (HOSPITAL_BASED_OUTPATIENT_CLINIC_OR_DEPARTMENT_OTHER): Payer: BC Managed Care – PPO | Admitting: Oncology

## 2017-08-05 VITALS — BP 124/74 | HR 82 | Temp 98.4°F | Resp 18 | Ht 65.0 in | Wt 218.1 lb

## 2017-08-05 DIAGNOSIS — C50311 Malignant neoplasm of lower-inner quadrant of right female breast: Secondary | ICD-10-CM

## 2017-08-05 DIAGNOSIS — C50511 Malignant neoplasm of lower-outer quadrant of right female breast: Secondary | ICD-10-CM

## 2017-08-05 DIAGNOSIS — Z170 Estrogen receptor positive status [ER+]: Principal | ICD-10-CM

## 2017-08-05 DIAGNOSIS — N951 Menopausal and female climacteric states: Secondary | ICD-10-CM | POA: Diagnosis not present

## 2017-08-05 LAB — COMPREHENSIVE METABOLIC PANEL
ALBUMIN: 3.5 g/dL (ref 3.5–5.0)
ALK PHOS: 69 U/L (ref 40–150)
ALT: 22 U/L (ref 0–55)
AST: 20 U/L (ref 5–34)
Anion Gap: 11 mEq/L (ref 3–11)
BILIRUBIN TOTAL: 0.28 mg/dL (ref 0.20–1.20)
BUN: 12 mg/dL (ref 7.0–26.0)
CALCIUM: 9 mg/dL (ref 8.4–10.4)
CO2: 21 mEq/L — ABNORMAL LOW (ref 22–29)
Chloride: 108 mEq/L (ref 98–109)
Creatinine: 0.7 mg/dL (ref 0.6–1.1)
Glucose: 76 mg/dl (ref 70–140)
POTASSIUM: 4 meq/L (ref 3.5–5.1)
Sodium: 140 mEq/L (ref 136–145)
Total Protein: 7.7 g/dL (ref 6.4–8.3)

## 2017-08-05 LAB — CBC WITH DIFFERENTIAL/PLATELET
BASO%: 0.4 % (ref 0.0–2.0)
BASOS ABS: 0 10*3/uL (ref 0.0–0.1)
EOS ABS: 0.1 10*3/uL (ref 0.0–0.5)
EOS%: 0.9 % (ref 0.0–7.0)
HEMATOCRIT: 37.8 % (ref 34.8–46.6)
HEMOGLOBIN: 12.5 g/dL (ref 11.6–15.9)
LYMPH#: 1.3 10*3/uL (ref 0.9–3.3)
LYMPH%: 23.5 % (ref 14.0–49.7)
MCH: 29.8 pg (ref 25.1–34.0)
MCHC: 33.2 g/dL (ref 31.5–36.0)
MCV: 89.9 fL (ref 79.5–101.0)
MONO#: 0.7 10*3/uL (ref 0.1–0.9)
MONO%: 12.3 % (ref 0.0–14.0)
NEUT#: 3.5 10*3/uL (ref 1.5–6.5)
NEUT%: 62.9 % (ref 38.4–76.8)
Platelets: 283 10*3/uL (ref 145–400)
RBC: 4.21 10*6/uL (ref 3.70–5.45)
RDW: 13.5 % (ref 11.2–14.5)
WBC: 5.6 10*3/uL (ref 3.9–10.3)

## 2017-08-10 ENCOUNTER — Telehealth: Payer: Self-pay | Admitting: Oncology

## 2017-08-10 NOTE — Telephone Encounter (Signed)
Scheduled appt per 10/24 los - sent reminder letter in the mail f/u in Jan 2018

## 2017-08-26 ENCOUNTER — Encounter: Payer: Self-pay | Admitting: Oncology

## 2017-08-27 ENCOUNTER — Encounter: Payer: Self-pay | Admitting: Emergency Medicine

## 2017-08-27 ENCOUNTER — Ambulatory Visit
Admission: RE | Admit: 2017-08-27 | Discharge: 2017-08-27 | Disposition: A | Discharge status: Home / Self Care | Payer: BC Managed Care – PPO | Source: Ambulatory Visit | Attending: Radiation Oncology | Admitting: Radiation Oncology

## 2017-08-27 ENCOUNTER — Encounter: Payer: Self-pay | Admitting: Radiation Oncology

## 2017-08-27 ENCOUNTER — Other Ambulatory Visit: Payer: Self-pay

## 2017-08-27 VITALS — BP 124/86 | HR 94 | Temp 98.3°F | Resp 20 | Wt 219.5 lb

## 2017-08-27 DIAGNOSIS — C50311 Malignant neoplasm of lower-inner quadrant of right female breast: Secondary | ICD-10-CM | POA: Diagnosis not present

## 2017-08-27 DIAGNOSIS — N644 Mastodynia: Secondary | ICD-10-CM | POA: Diagnosis not present

## 2017-08-27 DIAGNOSIS — Z923 Personal history of irradiation: Secondary | ICD-10-CM | POA: Diagnosis not present

## 2017-08-27 DIAGNOSIS — Z79899 Other long term (current) drug therapy: Secondary | ICD-10-CM | POA: Diagnosis not present

## 2017-08-27 DIAGNOSIS — Y842 Radiological procedure and radiotherapy as the cause of abnormal reaction of the patient, or of later complication, without mention of misadventure at the time of the procedure: Secondary | ICD-10-CM | POA: Insufficient documentation

## 2017-08-27 DIAGNOSIS — Z79891 Long term (current) use of opiate analgesic: Secondary | ICD-10-CM | POA: Insufficient documentation

## 2017-08-27 DIAGNOSIS — N6489 Other specified disorders of breast: Secondary | ICD-10-CM | POA: Insufficient documentation

## 2017-08-27 DIAGNOSIS — Z880 Allergy status to penicillin: Secondary | ICD-10-CM | POA: Diagnosis not present

## 2017-08-27 DIAGNOSIS — Z170 Estrogen receptor positive status [ER+]: Secondary | ICD-10-CM | POA: Diagnosis not present

## 2017-08-27 NOTE — Progress Notes (Signed)
Ms. Impson is here today for her follow-up appointment. States that she has sharp shooting pains in her breast area ,also stated that she has pain under her arm. Patient also states that she has a pain to the right of her incision.Patients  states that she has mild fatigue. Patients skin is hyperpigmented. Vitals:   08/27/17 1619  BP: 124/86  Pulse: 94  Resp: 20  Temp: 98.3 F (36.8 C)  TempSrc: Oral  SpO2: 97%  Weight: 219 lb 8 oz (99.6 kg)   Wt Readings from Last 3 Encounters:  08/27/17 219 lb 8 oz (99.6 kg)  08/05/17 218 lb 1.6 oz (98.9 kg)  06/08/17 222 lb 3.2 oz (100.8 kg)

## 2017-08-27 NOTE — Progress Notes (Signed)
Radiation Oncology         [phone]) [phone] ________________________________  Name: Ruth Nguyen MRN: [id number]  Date: 08/27/2017  DOB: Feb 12, 1971  Follow-Up Visit Note  CC: Scifres, Dorothy, PA-C  Magrinat, Virgie Dad, MD    ICD-10-CM   1. Malignant neoplasm of lower-inner quadrant of right breast of female, estrogen receptor positive ([place]) C50.311    Z17.0     Diagnosis:   46 y.o. 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   Interval Since Last Radiation: 1 month - 06/09/2017 - 07/27/2017   Narrative:  The patient returns today for a routine follow-up. Pt is doing well overall. Pt reports that she is taking tamoxifen with good tolerance overall. She notes right breast pain localized to the tumor site, which she advised her medical oncologist of and was evaluated for. She reports right axilla pain localized to the site of the seroma, which she informed her surgeon of. She lastly reports a sharp/shooting, "bone on bone" sensation underneath her right breast at her ribcage. She reports mild fatigue and trouble sleeping x 3 weeks due to leg pain. She states that she walks often.                      ALLERGIES:  is allergic to penicillins.  Meds: Current Outpatient Medications  Medication Sig Dispense Refill  . amitriptyline (ELAVIL) 25 MG tablet Take 25 mg by mouth daily.  1  . non-metallic deodorant (ALRA) MISC Apply 1 application topically daily as needed.    . tamoxifen (NOLVADEX) 20 MG tablet Take 20 mg by mouth daily.    Marland Kitchen albuterol (ACCUNEB) 0.63 MG/3ML nebulizer solution Take 1 ampule by nebulization every 6 (six) hours as needed for wheezing.    . fluconazole (DIFLUCAN) 150 MG tablet Take 1 tablet (150 mg total) by mouth daily. Takes second tablet 72 hours later (Patient not taking: Reported on 08/27/2017) 2 tablet 0  . hyaluronate sodium (RADIAPLEXRX) GEL Apply 1 application topically once.    Marland Kitchen  HYDROcodone-acetaminophen (NORCO) 5-325 MG tablet Take 1 tablet by mouth every 4 (four) hours as needed for moderate pain. (Patient not taking: Reported on 08/27/2017) 10 tablet 0   No current facility-administered medications for this encounter.     Physical Findings: The patient is in no acute distress. Patient is alert and oriented.  weight is 219 lb 8 oz (99.6 kg). Her oral temperature is 98.3 F (36.8 C). Her blood pressure is 124/86 and her pulse is 94. Her respiration is 20 and oxygen saturation is 97%. .  No significant changes.Lungs are clear to auscultation bilaterally. Heart has regular rate and rhythm. No palpable cervical, supraclavicular, or axillary adenopathy. Abdomen soft, non-tender, normal bowel sounds.  Mild edema in the nipple aerolar complex area. Axillary region with mild swelling consistent with seroma. Mild TTP in the right axillary area. Pt has some mild TTP along rib cage and lower breast region.    Lab Findings: Lab Results  Component Value Date   WBC 5.6 08/05/2017   HGB 12.5 08/05/2017   HCT 37.8 08/05/2017   MCV 89.9 08/05/2017   PLT 283 08/05/2017    Radiographic Findings: No results found.  Impression:  The patient is recovering from the effects of radiation. No evidence of recurrence on clinical exam. Persistent discomfort in the breast and surrounding area which should improve over the next several weeks.   Plan: Pt will  FU  with Surgery in December, Medical Oncology in January 2019, and Radiation Oncology in April 2019.   ____________________________________ -----------------------------------  Blair Promise, PhD, MD    This document serves as a record of services personally performed by Gery Pray, MD. It was created on his behalf by Valeta Harms, a trained medical scribe. The creation of this record is based on the scribe's personal observations and the provider's statements to them. This document has been checked and approved by the  attending provider.

## 2017-10-19 IMAGING — US ULTRASOUND RIGHT BREAST COMPLETE
1 series · 6 of 6 positions shown · non-contrast
Comparison: Previous exam(s).

CLINICAL DATA: 48-year-old female status post right lumpectomy and
axillary lymph node removal in April 2017. The patient is currently
undergoing radiation therapy. She reports painful, red and warm
right axillary lump which developed 2 weeks ago. The patient
completed a course of antibiotics with marked interval improvement
in her clinical symptoms over the last 7-10 days. Current residual
symptoms include a residual lump/ridge without painful or
inflammatory symptoms.

EXAM:
ULTRASOUND OF THE RIGHT AXILLA

[Series 1: ultrasound right breast complete · 0.07mm/px · 6 of 6 slices shown]
[im 1/6]
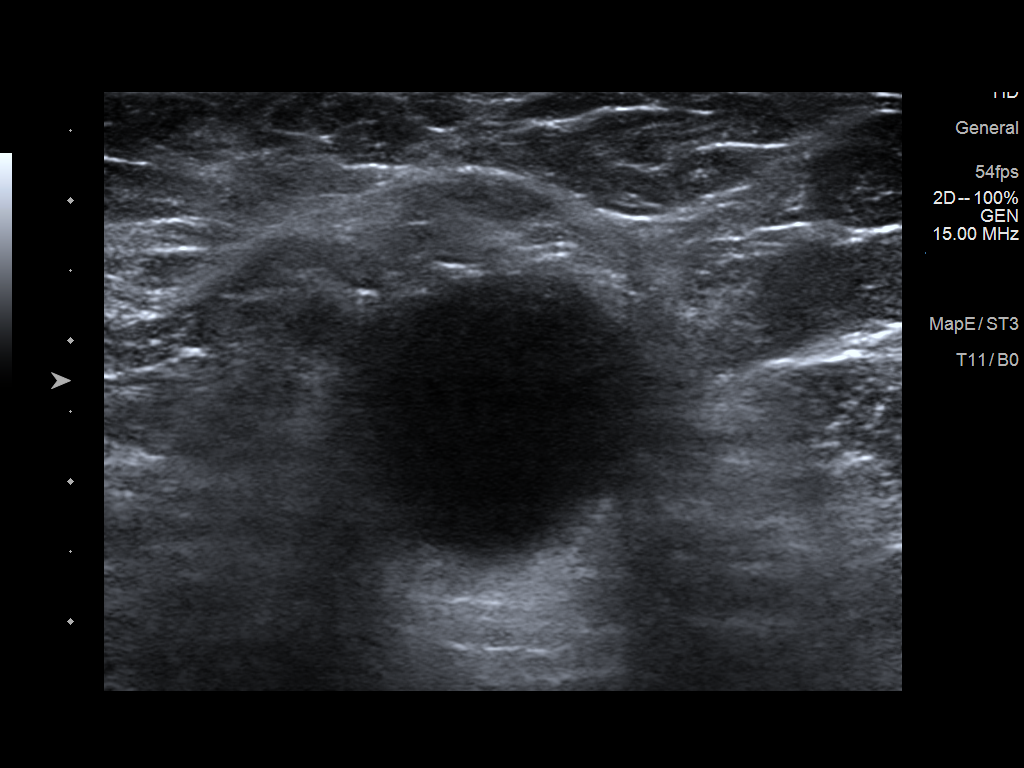
[im 2/6]
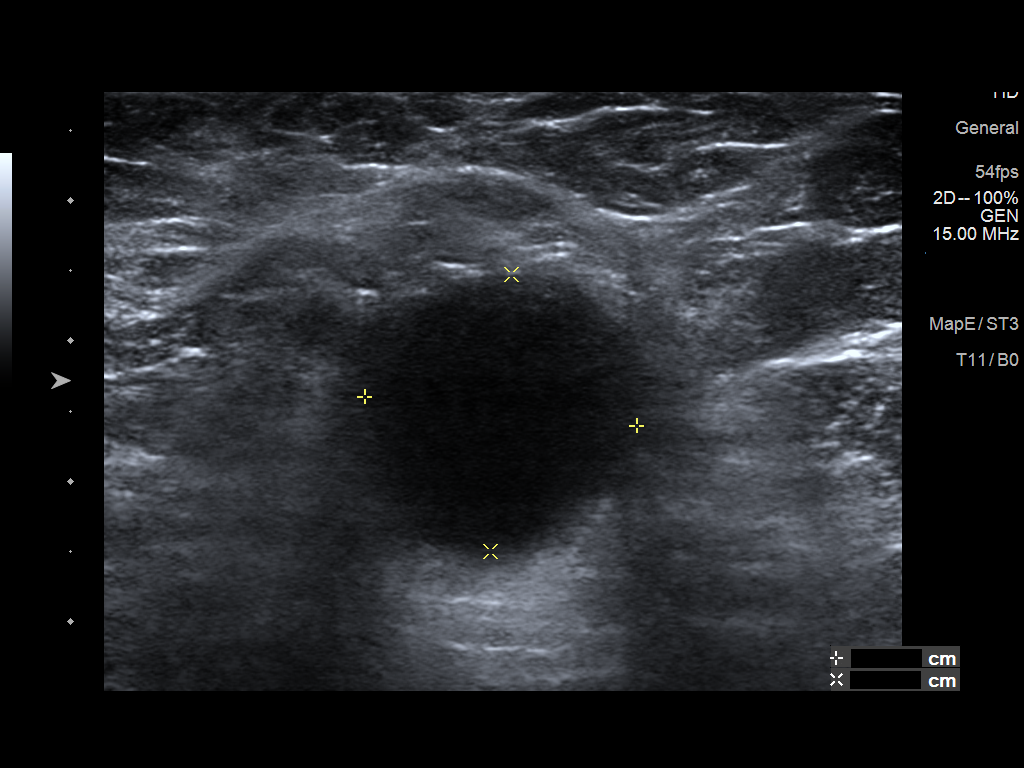
[im 3/6]
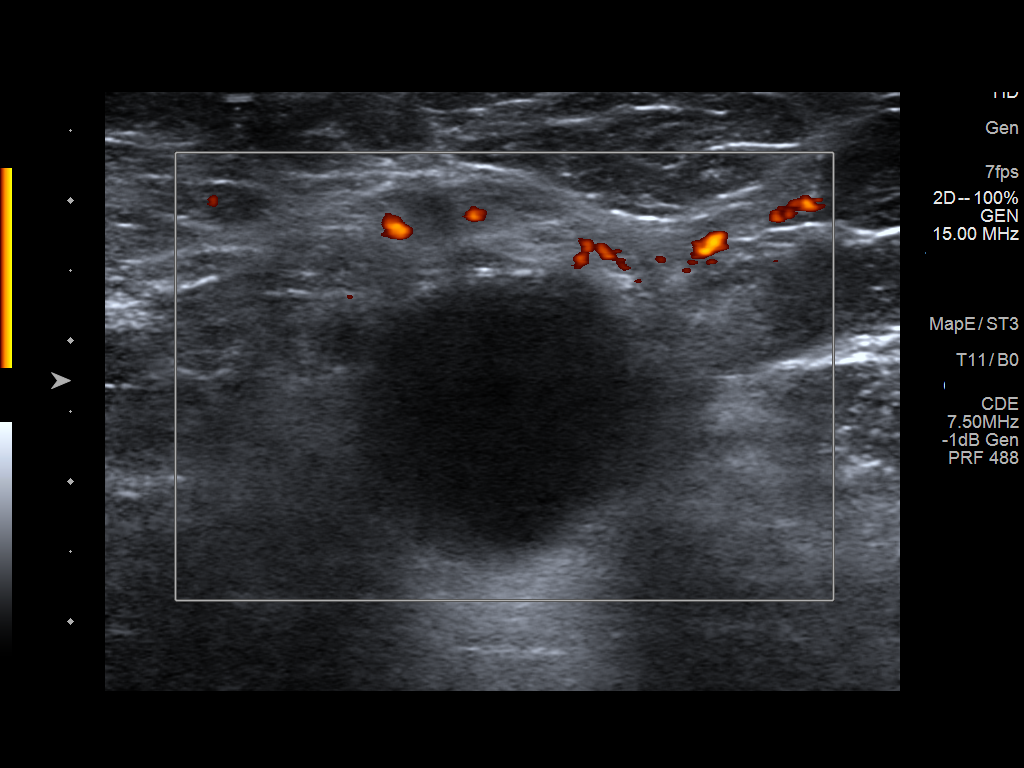
[im 4/6]
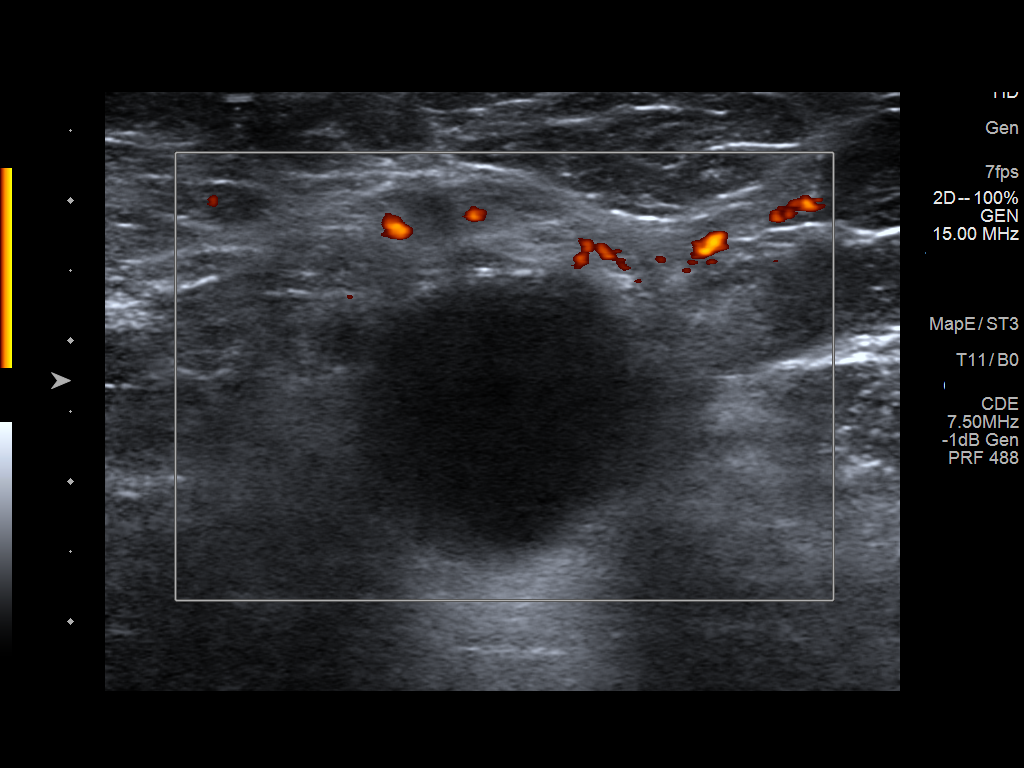
[im 5/6]
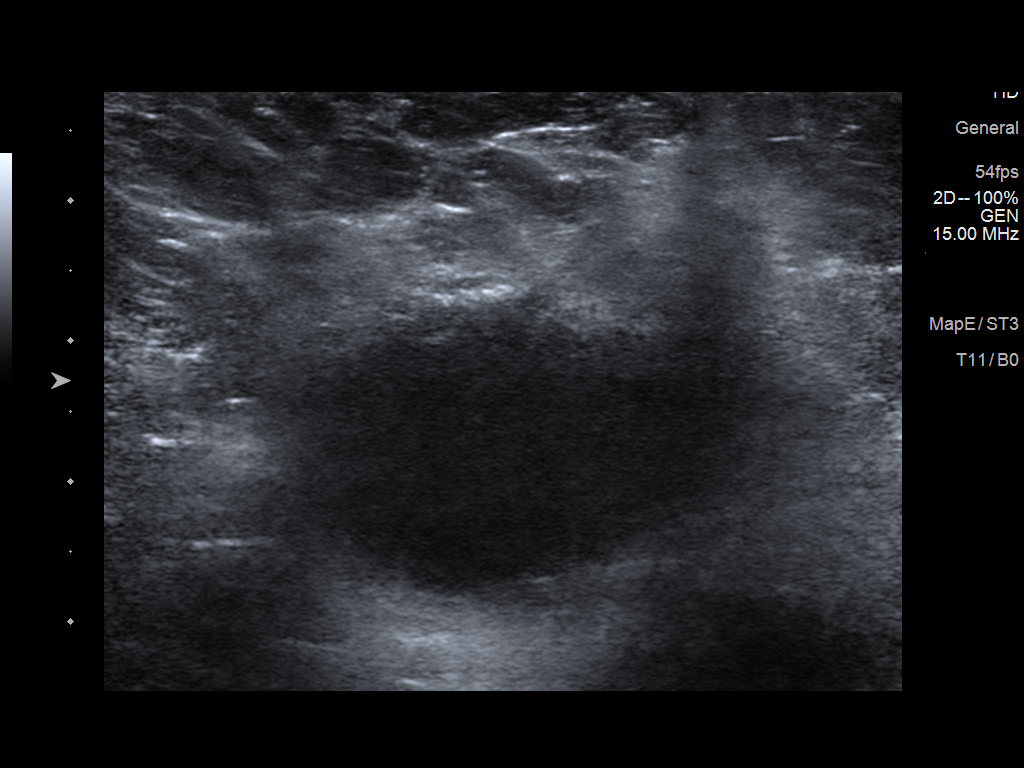
[im 6/6]
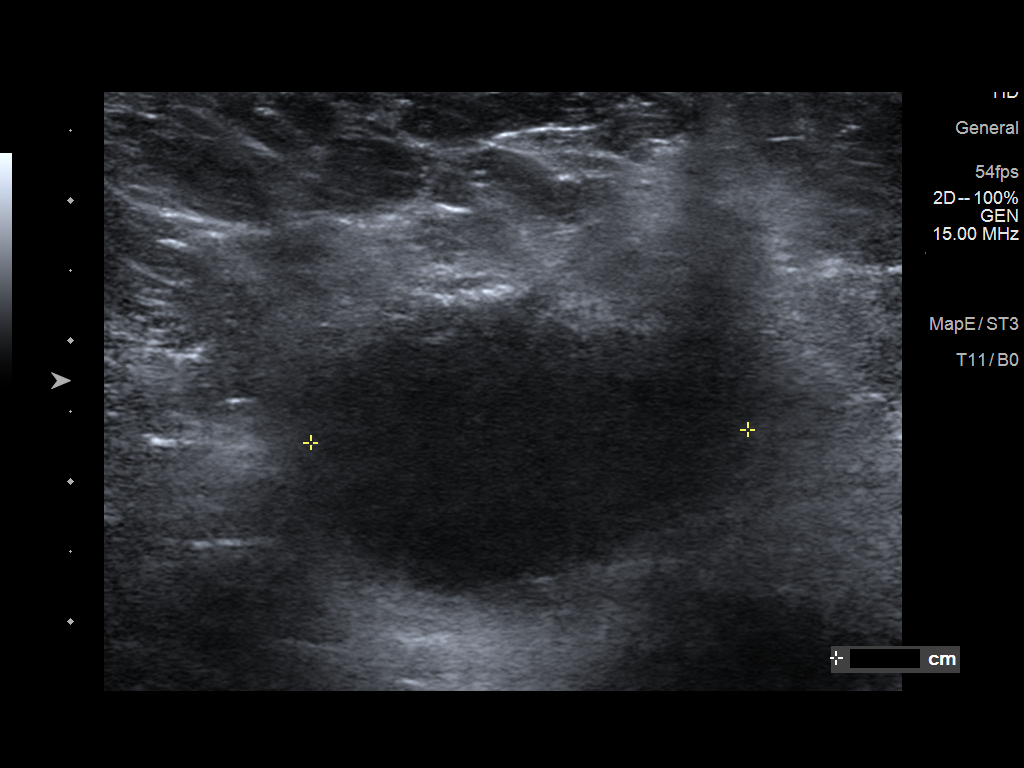

[6 of 6 positions shown; findings below may reference images not displayed]

FINDINGS: On physical exam, there is diffuse redness and edema of the right
breast consistent with radiation related changes. No additional
focal skin changes are noted in the right axilla. A soft mass is
identified in the deep axillary tissues.

Targeted ultrasound is performed, showing a circumscribed anechoic
mass in the right axilla. It measures 3.1 x 0.2 x 0.2 cm. The
collection demonstrates a thin wall without significant peripheral
vascularity or inflammatory changes.
IMPRESSION: Right axillary fluid collection with imaging characteristics most
consistent with a postoperative seroma. Superinfection of this
collection is difficult to exclude with imaging only, but is felt
unlikely given the patient's marked interval improvement/resolution
of symptoms. Image guided aspiration can be performed as clinically
indicated.

The findings were discussed with Dr. Blain Jumper on 07/21/2017 at
[DATE] p.m..

RECOMMENDATION:
Continued clinical management and treatment. Image guided aspiration
of the right axillary fluid collection can be performed as
clinically indicated.

I have discussed the findings and recommendations with the patient.
Results were also provided in writing at the conclusion of the
visit. If applicable, a reminder letter will be sent to the patient
regarding the next appointment.

BI-RADS CATEGORY  2: Benign.

## 2017-11-05 ENCOUNTER — Other Ambulatory Visit: Payer: Self-pay | Admitting: *Deleted

## 2017-11-05 DIAGNOSIS — Z170 Estrogen receptor positive status [ER+]: Principal | ICD-10-CM

## 2017-11-05 DIAGNOSIS — C50311 Malignant neoplasm of lower-inner quadrant of right female breast: Secondary | ICD-10-CM

## 2017-11-09 ENCOUNTER — Inpatient Hospital Stay: Payer: BC Managed Care – PPO | Attending: Oncology | Admitting: Oncology

## 2017-11-09 ENCOUNTER — Encounter: Payer: Self-pay | Admitting: *Deleted

## 2017-11-09 ENCOUNTER — Inpatient Hospital Stay: Payer: BC Managed Care – PPO

## 2017-11-09 VITALS — BP 121/81 | HR 82 | Temp 98.6°F | Resp 18 | Ht 65.0 in | Wt 218.4 lb

## 2017-11-09 DIAGNOSIS — Z170 Estrogen receptor positive status [ER+]: Principal | ICD-10-CM

## 2017-11-09 DIAGNOSIS — N951 Menopausal and female climacteric states: Secondary | ICD-10-CM | POA: Insufficient documentation

## 2017-11-09 DIAGNOSIS — N644 Mastodynia: Secondary | ICD-10-CM

## 2017-11-09 DIAGNOSIS — C50311 Malignant neoplasm of lower-inner quadrant of right female breast: Secondary | ICD-10-CM

## 2017-11-09 DIAGNOSIS — G4700 Insomnia, unspecified: Secondary | ICD-10-CM | POA: Diagnosis not present

## 2017-11-09 LAB — CBC WITH DIFFERENTIAL (CANCER CENTER ONLY)
Basophils Absolute: 0 10*3/uL (ref 0.0–0.1)
Basophils Relative: 0 %
EOS ABS: 0.1 10*3/uL (ref 0.0–0.5)
EOS PCT: 1 %
HCT: 37.4 % (ref 34.8–46.6)
Hemoglobin: 12.3 g/dL (ref 11.6–15.9)
LYMPHS ABS: 1.8 10*3/uL (ref 0.9–3.3)
Lymphocytes Relative: 25 %
MCH: 29.7 pg (ref 25.1–34.0)
MCHC: 32.9 g/dL (ref 31.5–36.0)
MCV: 90.2 fL (ref 79.5–101.0)
MONOS PCT: 8 %
Monocytes Absolute: 0.6 10*3/uL (ref 0.1–0.9)
Neutro Abs: 4.5 10*3/uL (ref 1.5–6.5)
Neutrophils Relative %: 66 %
PLATELETS: 297 10*3/uL (ref 145–400)
RBC: 4.15 MIL/uL (ref 3.70–5.45)
RDW: 13.4 % (ref 11.2–16.1)
WBC Count: 7 10*3/uL (ref 3.9–10.3)

## 2017-11-09 LAB — CMP (CANCER CENTER ONLY)
ALT: 25 U/L (ref 0–55)
AST: 20 U/L (ref 5–34)
Albumin: 3.6 g/dL (ref 3.5–5.0)
Alkaline Phosphatase: 79 U/L (ref 40–150)
Anion gap: 8 (ref 3–11)
BUN: 12 mg/dL (ref 7–26)
CHLORIDE: 106 mmol/L (ref 98–109)
CO2: 24 mmol/L (ref 22–29)
CREATININE: 0.75 mg/dL (ref 0.60–1.10)
Calcium: 8.9 mg/dL (ref 8.4–10.4)
GFR, Est AFR Am: >60 mL/min (ref >60)
GFR, Estimated: >60 mL/min (ref >60)
Glucose, Bld: 68 mg/dL — ABNORMAL LOW (ref 70–140)
Potassium: 3.5 mmol/L (ref 3.3–4.7)
SODIUM: 138 mmol/L (ref 136–145)
Total Bilirubin: 0.3 mg/dL (ref 0.2–1.2)
Total Protein: 7.8 g/dL (ref 6.4–8.3)

## 2017-11-09 MED ORDER — GABAPENTIN 300 MG PO CAPS: 300.0000 mg | ORAL_CAPSULE | Freq: Every day | ORAL | 4 refills | Status: DC

## 2017-11-09 NOTE — Progress Notes (Signed)
[facility]  [phone] [phone]) [phone]     ID: Ruth Nguyen DOB: 14-Jun-1971  MR#: [id number]  ALP#:[id number]  Patient Care Team: Scifres, Durel Salts as PCP - General (Physician Assistant) Everlene Farrier, MD as Consulting Physician (Obstetrics and Gynecology) Excell Seltzer, MD as Consulting Physician (General Surgery) Kaedan Richert, Virgie Dad, MD as Consulting Physician (Oncology) Gery Pray, MD as Consulting Physician (Radiation Oncology) OTHER MD:  CHIEF COMPLAINT: Estrogen receptor positive breast cancer  CURRENT TREATMENT: Tamoxifen   BREAST CANCER HISTORY: From the original intake note:  Ruth Nguyen's college roommate recently died from stage IV breast cancer. This was a major event in Ruth Nguyen's life and it motivated her to start doing breast self exams. She also noted pain in her left breast and she brought this to medical attention. Dr. Gertie Fey set her up for bilateral diagnostic mammography with tomography and bilateral breast ultrasonography at the [facility] 03/19/2017. This showed the breast density to be category B. In the lower inner quadrant of the right breast there was an area of asymmetry which on ultrasound corresponded to an irregular hypoechoic mass at the 4:00 radiant 7 cm from the nipple measuring 0.9 cm. The right axilla was negative by ultrasonography  The left breast by contrast was negative both by mammography and ultrasonography.   Biopsy of the right breast mass in question 03/23/2017 showed ([id number]) and invasive ductal carcinoma, grade 1, estrogen receptor 95% positive, progesterone receptor 100% positive, both with strong staining intensity, with an MIB-1 of 5%, and no HER-2 amplification, the signals ratio being 1.42 and the number per cell 1.85.  The patient's subsequent history is as detailed below.  INTERVAL HISTORY: Ruth Nguyen  returns today for follow-up and treatment of her estrogen receptor positive  breast cancer. She notes that she is still feeling some side affects from radiation treatments. She continues on tamoxifen, with good tolerance.    REVIEW OF SYSTEMS: Ruth Nguyen is hoping that her seroma has migrated to a different spot. She notes that she is having pain and discomfort in the upper medial right breast. She notes that that there a knot in the right upper outer area of the breast where the seroma was located. She notes that she has pain in her rib under breast. She notes that her whole breast feels different and feels lumpy. She notes that the pains are all different from each other and occur in different places and times. She notes that she no longer knows how to self examine the breast to check for abnormalities. She notes that she had pain in the left breast before finding cancer in the right breast. She reports that she feels depressed at times, but she is trying to stay positive by starting a new diet challenge from school in which, she is trying to eat more vegetables. She notes that she is taking an online class learning about horses. She notes that her holidays were challenging and stressful due to their water heater and toilets not working. She also notes that they caught a stomach virus.  Her daughter came home from college to visit. She denies unusual headaches, visual changes, nausea, vomiting, or dizziness. There has been no unusual cough, phlegm production, or pleurisy. This been no change in bowel or bladder habits. She denies unexplained fatigue or unexplained weight loss, bleeding, rash, or fever. A detailed review of systems was otherwise stable.     PAST MEDICAL HISTORY: Past Medical History:  Diagnosis Date  . Anemia   . Anxiety   .  Breast cancer ([place])   . Complication of anesthesia   . Family history of breast cancer 04/01/2017  . History of radiation therapy 06/09/2017-07/27/2017   right breast was treated to 50.4 Gy in 28 fractions, rightr breast was boosted to 10  Gy in 5 fractions  . Migraines   . PONV (postoperative nausea and vomiting)    pt needs scope patch    PAST SURGICAL HISTORY: Past Surgical History:  Procedure Laterality Date  . BREAST LUMPECTOMY WITH RADIOACTIVE SEED AND SENTINEL LYMPH NODE BIOPSY Right 04/21/2017   Procedure: BREAST LUMPECTOMY WITH RADIOACTIVE SEED AND SENTINEL LYMPH NODE BIOPSY;  Surgeon: Excell Seltzer, MD;  Location: [facility];  Service: General;  Laterality: Right;  . CLOSED REDUCTION ELBOW DISLOCATION    . DILATION AND CURETTAGE OF UTERUS    . miscarriage    . RE-EXCISION OF BREAST LUMPECTOMY Right 04/29/2017   Procedure: RIGHT RE-EXCISION OF BREAST LUMPECTOMY;  Surgeon: Excell Seltzer, MD;  Location: [facility];  Service: General;  Laterality: Right;  ERAS PATHWAY  . WISDOM TOOTH EXTRACTION      FAMILY HISTORY Family History  Problem Relation Age of Onset  . Fibromyalgia Mother   . Heart failure Mother   . Mental illness Sister   . ADD / ADHD Daughter   . Asthma Son   . ADD / ADHD Son   . Osteoporosis Maternal Grandmother   . Parkinson's disease Maternal Grandfather   . Breast cancer Paternal Grandmother 30       died in 23's  . Stroke Paternal Grandfather   . Cancer Paternal Grandfather 33       abdominal cancer- stomach?Colon?  The patient's parents are both 52 years old as of June 2018. The patient has one brother, one sister. A paternal grandmother was diagnosed with breast cancer in her 44s. A paternal grandfather had "stomach" cancer at an older adult. There is no history of ovarian cancer in the family.  GYNECOLOGIC HISTORY:  No LMP recorded. Menarche age 63, first live birth age 48, she is [place] P2. She still has regular periods which last 5-7 days of which the first 2 days are heavy. She took oral contraceptives briefly for about a year remotely, stopped because of headaches.  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 She is divorced, at home is just she and her 2 children, Ruth Nguyen, studying English at [employer], and eighth greater, as of June 2018. 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    ADVANCED DIRECTIVES: At the 04/01/2017 visit the patient was given the appropriate documents to complete and notarize at her discretion   HEALTH MAINTENANCE: Social History   Tobacco Use  . Smoking status: Never Smoker  . Smokeless tobacco: Never Used  Substance Use Topics  . Alcohol use: No  . Drug use: No     Colonoscopy:Never  PAP:  Bone density:[facility], 02/14/2013, T score -0.6   Allergies  Allergen Reactions  . Penicillins Itching    Current Outpatient Medications  Medication Sig Dispense Refill  . albuterol (ACCUNEB) 0.63 MG/3ML nebulizer solution Take 1 ampule by nebulization every 6 (six) hours as needed for wheezing.    Marland Kitchen amitriptyline (ELAVIL) 25 MG tablet Take 25 mg by mouth daily.  1  . gabapentin (NEURONTIN) 300 MG capsule Take 1 capsule (300 mg total) by mouth at bedtime. 90 capsule 4  .  tamoxifen (NOLVADEX) 20 MG tablet Take 20 mg by mouth daily.     No current facility-administered medications for this visit.     OBJECTIVE: Middle-aged white woman  There were no vitals filed for this visit.   There is no height or weight on file to calculate BMI.   There were no vitals filed for this visit.     ECOG FS:1 - Symptomatic but completely ambulatory  Sclerae unicteric, EOMs intact Oropharynx clear and moist No cervical or supraclavicular adenopathy Lungs no rales or rhonchi Heart regular rate and rhythm Abd soft, nontender, positive bowel sounds MSK no focal spinal tenderness, no upper extremity lymphedema Neuro: nonfocal, well oriented, appropriate affect Breasts: I do not palpate a mass in the right breast meaning not only that I do not palpate the mass she is feeling but I also do not  palpate a seroma.  There is no erythema.  There is some tenderness to palpation particularly in the lower inner aspect and inferior aspects of the breast but also in the upper outer area.  The left breast is unremarkable.  Both axilla are benign.  LAB RESULTS:  CMP     Component Value Date/Time   NA 138 11/09/2017 1452   NA 140 08/05/2017 1553   K 3.5 11/09/2017 1452   K 4.0 08/05/2017 1553   CL 106 11/09/2017 1452   CO2 24 11/09/2017 1452   CO2 21 (L) 08/05/2017 1553   GLUCOSE 68 (L) 11/09/2017 1452   GLUCOSE 76 08/05/2017 1553   BUN 12 11/09/2017 1452   BUN 12.0 08/05/2017 1553   CREATININE 0.7 08/05/2017 1553   CALCIUM 8.9 11/09/2017 1452   CALCIUM 9.0 08/05/2017 1553   PROT 7.8 11/09/2017 1452   PROT 7.7 08/05/2017 1553   ALBUMIN 3.6 11/09/2017 1452   ALBUMIN 3.5 08/05/2017 1553   AST 20 11/09/2017 1452   AST 20 08/05/2017 1553   ALT 25 11/09/2017 1452   ALT 22 08/05/2017 1553   ALKPHOS 79 11/09/2017 1452   ALKPHOS 69 08/05/2017 1553   BILITOT 0.3 11/09/2017 1452   BILITOT 0.28 08/05/2017 1553   GFRNONAA >60 11/09/2017 1452   GFRAA >60 11/09/2017 1452    No results found for: TOTALPROTELP, ALBUMINELP, A1GS, A2GS, BETS, BETA2SER, GAMS, MSPIKE, SPEI  No results found for: Nils Pyle, [facility]  Lab Results  Component Value Date   WBC 7.0 11/09/2017   NEUTROABS 4.5 11/09/2017   HGB 12.5 08/05/2017   HCT 37.4 11/09/2017   MCV 90.2 11/09/2017   PLT 297 11/09/2017      Chemistry      Component Value Date/Time   NA 138 11/09/2017 1452   NA 140 08/05/2017 1553   K 3.5 11/09/2017 1452   K 4.0 08/05/2017 1553   CL 106 11/09/2017 1452   CO2 24 11/09/2017 1452   CO2 21 (L) 08/05/2017 1553   BUN 12 11/09/2017 1452   BUN 12.0 08/05/2017 1553   CREATININE 0.7 08/05/2017 1553      Component Value Date/Time   CALCIUM 8.9 11/09/2017 1452   CALCIUM 9.0 08/05/2017 1553   ALKPHOS 79 11/09/2017 1452   ALKPHOS 69 08/05/2017 1553   AST 20 11/09/2017  1452   AST 20 08/05/2017 1553   ALT 25 11/09/2017 1452   ALT 22 08/05/2017 1553   BILITOT 0.3 11/09/2017 1452   BILITOT 0.28 08/05/2017 1553       No results found for: LABCA2  No components found for: OITGPQ982  No results  for input(s): INR in the last 168 hours.  Urinalysis    Component Value Date/Time   COLORURINE STRAW (A) 04/13/2017 0757   APPEARANCEUR CLEAR 04/13/2017 0757   LABSPEC 1.002 (L) 04/13/2017 0757   PHURINE 6.0 04/13/2017 0757   GLUCOSEU NEGATIVE 04/13/2017 0757   HGBUR SMALL (A) 04/13/2017 0757   BILIRUBINUR NEGATIVE 04/13/2017 0757   KETONESUR NEGATIVE 04/13/2017 0757   PROTEINUR NEGATIVE 04/13/2017 0757   NITRITE NEGATIVE 04/13/2017 0757   LEUKOCYTESUR NEGATIVE 04/13/2017 0757     STUDIES: Routine mammography will resume in June 2029  ELIGIBLE FOR AVAILABLE RESEARCH PROTOCOL: No  ASSESSMENT: 47 y.o. [place] woman status post right breast lower outer quadrant biopsy 03/23/2017 for a clinical T1b N0, stage IA invasive ductal carcinoma, grade 1, estrogen and progesterone receptor positive, HER-2 nonamplified, with an MIB-1 of 5%  (1) genetics testing 04/01/2017 through the"Common Hereditary Cancer Panel",performed at [facility], found no deleterious mutations in  PC, ATM, AXIN2, BARD1, BMPR1A, BRCA1, BRCA2, BRIP1, CDH1, CDKN2A (p14ARF), CDKN2A (p16INK4a), CHEK2, CTNNA1, DICER1, EPCAM (Deletion/duplication testing only), GREM1 (promoter region deletion/duplication testing only), KIT, MEN1, MLH1, MSH2, MSH3, MSH6, MUTYH, NBN, NF1, NHTL1, PALB2, PDGFRA, PMS2, POLD1, POLE, PTEN, RAD50, RAD51C, RAD51D, SDHB, SDHC, SDHD, SMAD4, SMARCA4. STK11, TP53, TSC1, TSC2, and VHL.    (2) Oncotype DX score of 14 predicts a 10 year outside the breast risk of recurrence of 9% if the patient's only systemic therapy is tamoxifen for 5 years. It also predicts no benefit from adjuvant chemotherapy.  (3) right lumpectomy and sentinel lymph node sampling  04/21/2017 showed a pT1b pN0, stage IA invasive ductal carcinoma, grade 1, with a focally present anterior margin for DCIS  (a) margins cleared with additional surgery 04/29/2017  (4) Adjuvant radiation 06/09/2017 to 07/27/2017 Site/dose:    1. The Right breast was treated to 50.4 Gy in 28 fractions of 1.8 Gy per fraction. 2. The Right breast was boosted to 10 Gy in 5 fractions of 2 Gy.   (5) tamoxifen started neoadjuvantly 04/01/2017 continued through surgery and radiation  PLAN: Ruth Nguyen is now about half a year out from definitive surgery for her breast cancer.  She is very concerned because of the different pain she has in her right breast--there are different location and quality--and because the seroma appears to have moved.  My exam of her right breast is generally unremarkable except for postop tenderness.  I do not palpate a seroma.  I think it will be important to document that things are looking well in that breast and I am setting her up for an ultrasound hopefully to be done this week.  She is sleeping poorly and having nighttime hot flashes.  I am starting her on gabapentin.  We discussed the possible toxicity side effects and complications of that medication which she will only take at bedtime.  I strongly encouraged her to participate in our finding your new normal group  She will be seen Dr. Excell Seltzer in March.  She is going to return to see me in June after her mammography.  She knows to call for any other issues that may develop before that visit.  Moishe Schellenberg, Virgie Dad, MD  11/09/17 4:09 PM Medical Oncology and Hematology [facility] [address] [place], [place] [address] Tel. [phone]    Fax. [phone]  This document serves as a record of services personally performed by Lurline Del, MD. It was created on his behalf by Sheron Nightingale, a trained medical scribe. The creation  of this record is based on the scribe's personal observations and  the provider's statements to them.   I have reviewed the above documentation for accuracy and completeness, and I agree with the above.

## 2017-11-10 ENCOUNTER — Other Ambulatory Visit: Payer: Self-pay | Admitting: Oncology

## 2017-11-10 DIAGNOSIS — N6310 Unspecified lump in the right breast, unspecified quadrant: Secondary | ICD-10-CM

## 2017-11-13 ENCOUNTER — Ambulatory Visit
Admission: RE | Admit: 2017-11-13 | Discharge: 2017-11-13 | Disposition: A | Discharge status: Home / Self Care | Payer: BC Managed Care – PPO | Source: Ambulatory Visit | Attending: Oncology | Admitting: Oncology

## 2017-11-13 DIAGNOSIS — C50311 Malignant neoplasm of lower-inner quadrant of right female breast: Secondary | ICD-10-CM

## 2017-11-13 DIAGNOSIS — Z170 Estrogen receptor positive status [ER+]: Principal | ICD-10-CM

## 2017-11-13 DIAGNOSIS — N6310 Unspecified lump in the right breast, unspecified quadrant: Secondary | ICD-10-CM

## 2017-11-13 HISTORY — DX: Personal history of irradiation: Z92.3

## 2018-01-25 ENCOUNTER — Ambulatory Visit
Admission: RE | Admit: 2018-01-25 | Discharge: 2018-01-25 | Disposition: A | Discharge status: Home / Self Care | Payer: BC Managed Care – PPO | Source: Ambulatory Visit | Attending: Radiation Oncology | Admitting: Radiation Oncology

## 2018-01-25 ENCOUNTER — Encounter: Payer: Self-pay | Admitting: Radiation Oncology

## 2018-01-25 ENCOUNTER — Other Ambulatory Visit: Payer: Self-pay

## 2018-01-25 VITALS — BP 117/65 | HR 98 | Temp 98.2°F | Wt 220.1 lb

## 2018-01-25 DIAGNOSIS — Z79810 Long term (current) use of selective estrogen receptor modulators (SERMs): Secondary | ICD-10-CM | POA: Insufficient documentation

## 2018-01-25 DIAGNOSIS — R197 Diarrhea, unspecified: Secondary | ICD-10-CM | POA: Insufficient documentation

## 2018-01-25 DIAGNOSIS — Z79899 Other long term (current) drug therapy: Secondary | ICD-10-CM | POA: Insufficient documentation

## 2018-01-25 DIAGNOSIS — Z923 Personal history of irradiation: Secondary | ICD-10-CM | POA: Insufficient documentation

## 2018-01-25 DIAGNOSIS — Z170 Estrogen receptor positive status [ER+]: Secondary | ICD-10-CM | POA: Diagnosis not present

## 2018-01-25 DIAGNOSIS — C50311 Malignant neoplasm of lower-inner quadrant of right female breast: Secondary | ICD-10-CM | POA: Insufficient documentation

## 2018-01-25 NOTE — Progress Notes (Signed)
Radiation Oncology         [phone]) [phone] ________________________________  Name: Ruth Nguyen MRN: [id number]  Date: 01/25/2018  DOB: May 17, 1971  Follow-Up Visit Note  CC: Scifres, Dorothy, PA-C  Magrinat, Virgie Dad, MD    ICD-10-CM   1. Malignant neoplasm of lower-inner quadrant of right breast of female, estrogen receptor positive ([place]) C50.311    Z17.0     Diagnosis:   47 y.o. 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   Interval Since Last Radiation: 6 months 06/09/2017 - 07/27/2017   Narrative:  The patient returns today for a routine follow-up. She reports episodes of diarrhea for the past 2 months. She has been evaluated by her PCP and is starting probiotic. She reports occasional pain/discomfort to the right breast that she relates to nerve regrowth. She also notes nipple hardness, but reports her surgeon is aware and deemed it an expected outcome. The patient reports she has felt stressed with her work and personal life. She is seeking help with counseling and is looking forward to getting more involved with survivor programs in the community. She continues on Tamoxifen. She denies skin rashes or dermatologic issues at this time.  Ultrasound of the right breast was performed on 11/13/17 due to a postoperative seroma. This showed no mammographic or sonographic evidence of malignancy involving the right breast. Benign fat necrosis and an associated oil cyst in the axillary tail of the right breast/low right axilla in the area of patient concern. Interval resolution of the previously identified right axillary seroma. No pathologic right axillary lymphadenopathy.                      ALLERGIES:  is allergic to penicillins.  Meds: Current Outpatient Medications  Medication Sig Dispense Refill  . albuterol (ACCUNEB) 0.63 MG/3ML nebulizer solution Take 1 ampule by nebulization every 6 (six) hours as needed for wheezing.     Marland Kitchen amitriptyline (ELAVIL) 25 MG tablet Take 25 mg by mouth daily.  1  . tamoxifen (NOLVADEX) 20 MG tablet Take 20 mg by mouth daily.     No current facility-administered medications for this encounter.     Physical Findings: The patient is in no acute distress. Patient is alert and oriented.  weight is 220 lb 2 oz (99.8 kg). Her oral temperature is 98.2 F (36.8 C). Her blood pressure is 117/65 and her pulse is 98. .  No significant changes.Lungs are clear to auscultation bilaterally. Heart has regular rate and rhythm. No palpable cervical, supraclavicular, or axillary adenopathy. Abdomen soft, non-tender, normal bowel sounds.    Left breast no palpable mass or nipple discharge. Right breast there is mild hyperpigmentation changes. Some induration at the lumpectomy site in the lower outer aspect of the breast. Mild tenderness in the upper outer quadrant where the patient reports she is found to have fat necrosis.  Lab Findings: Lab Results  Component Value Date   WBC 7.0 11/09/2017   HGB 12.5 08/05/2017   HCT 37.4 11/09/2017   MCV 90.2 11/09/2017   PLT 297 11/09/2017    Radiographic Findings: No results found.  Impression: The patient is recovering from the effects of radiation. No evidence of recurrence on clinical exam. The patient was given instructions on gentle massage to help with the tissue along the lumpectomy site.   Plan: Patient will follow-up in radiation oncology prn. The patient will follow-up with Dr. Jana Hakim on 03/29/18. The  patient will continue with Tamoxifen. She will remain under close follow-up with medical oncology and surgery. ____________________________________ -----------------------------------  Blair Promise, PhD, MD    This document serves as a record of services personally performed by Gery Pray, MD. It was created on his behalf by Bethann Humble, a trained medical scribe. The creation of this record is based on the scribe's personal  observations and the provider's statements to them. This document has been checked and approved by the attending provider.

## 2018-01-25 NOTE — Progress Notes (Signed)
Ruth Nguyen presents for F/U appt. Has been having bouts of diarrhea for about 2 months now but has seen PCP and is starting probiotic per recommendation. Denies any skin rashes or other dermatology issues. Has reported some pain/discomfort in right breast related to nerve regrowth and some nipple hardness, but reported that her surgeon is aware and deemed an expected outcome. Pt has been having a lot of stress at work and personal life, but is seeking help with a counselor and is looking forward to getting more involved with survivor programs in the community.  Vitals:   01/25/18 1614  BP: 117/65  Pulse: 98  Temp: 98.2 F (36.8 C)  TempSrc: Oral  Weight: 220 lb 2 oz (99.8 kg)     Wt Readings from Last 3 Encounters:  01/25/18 220 lb 2 oz (99.8 kg)  11/09/17 218 lb 6.4 oz (99.1 kg)  08/27/17 219 lb 8 oz (99.6 kg)

## 2018-01-28 ENCOUNTER — Ambulatory Visit: Payer: BC Managed Care – PPO | Admitting: Radiation Oncology

## 2018-01-30 ENCOUNTER — Other Ambulatory Visit: Payer: Self-pay | Admitting: Oncology

## 2018-02-08 ENCOUNTER — Other Ambulatory Visit: Payer: Self-pay | Admitting: Obstetrics and Gynecology

## 2018-02-08 DIAGNOSIS — Z1231 Encounter for screening mammogram for malignant neoplasm of breast: Secondary | ICD-10-CM

## 2018-03-26 ENCOUNTER — Other Ambulatory Visit: Payer: Self-pay | Admitting: Obstetrics and Gynecology

## 2018-03-26 ENCOUNTER — Other Ambulatory Visit: Payer: Self-pay | Admitting: *Deleted

## 2018-03-26 DIAGNOSIS — C50311 Malignant neoplasm of lower-inner quadrant of right female breast: Secondary | ICD-10-CM

## 2018-03-26 DIAGNOSIS — Z853 Personal history of malignant neoplasm of breast: Secondary | ICD-10-CM

## 2018-03-26 DIAGNOSIS — Z170 Estrogen receptor positive status [ER+]: Principal | ICD-10-CM

## 2018-03-28 NOTE — Progress Notes (Signed)
[place]  [phone] [phone]     ID: Ruth Nguyen DOB: [birth year]  MR#: [id number]  GQQ#:[id number]  Patient Care Team: Scifres, Durel Salts as PCP - General (Physician Assistant) Everlene Farrier, MD as Consulting Physician (Obstetrics and Gynecology) Excell Seltzer, MD as Consulting Physician (General Surgery) Magrinat, Virgie Dad, MD as Consulting Physician (Oncology) Gery Pray, MD as Consulting Physician (Radiation Oncology) OTHER MD:  CHIEF COMPLAINT: Estrogen receptor positive breast cancer  CURRENT TREATMENT: Tamoxifen   BREAST CANCER HISTORY: From the original intake note:  Yaelis's college roommate recently died from stage IV breast cancer. This was a major event in Kawthar's life and it motivated her to start doing breast self exams. She also noted pain in her left breast and she brought this to medical attention. Dr. Gertie Fey set her up for bilateral diagnostic mammography with tomography and bilateral breast ultrasonography at the [facility] 03/19/2017. This showed the breast density to be category B. In the lower inner quadrant of the right breast there was an area of asymmetry which on ultrasound corresponded to an irregular hypoechoic mass at the 4:00 radiant 7 cm from the nipple measuring 0.9 cm. The right axilla was negative by ultrasonography  The left breast by contrast was negative both by mammography and ultrasonography.   Biopsy of the right breast mass in question 03/23/2017 showed ([id number]) and invasive ductal carcinoma, grade 1, estrogen receptor 95% positive, progesterone receptor 100% positive, both with strong staining intensity, with an MIB-1 of 5%, and no HER-2 amplification, the signals ratio being 1.42 and the number per cell 1.85.  The patient's subsequent history is as detailed below.  INTERVAL HISTORY: Mairi returns today for follow-up and treatment of her estrogen receptor positive  breast cancer accompanied by [facility] Kidney. She continues on tamoxifen, with good tolerance. She is having frequent but irregular hot flashes that occur at night. She is no longer taking the gabapentin due to nausea.    To evaluate a right axillary mass, she underwent diagnostic unilateral right breast mammography with CAD and tomography and right breast ultrasonography on 11/13/2017 at [facility] showing: breast density category C. No mammographic or sonographic evidence of malignancy involving the right breast. Benign fat necrosis and an associated oil cyst in the axillary tail of the right breast/low right axilla in the area of patient concern. Interval resolution of the previously identified right axillary seroma. No pathologic right axillary lymphadenopathy. Expected post radiation changes involving the right breast.  She is scheduled for bilateral mammography on 03/30/2018.    REVIEW OF SYSTEMS: Aiyana reports that she went for her PAP screening. She was scheduled for a sonohystogram with her OB/GYN.  She is having irregular periods with her last being in May 2019. She did not have a period from November 2018- January 2019. She had a period in February. Her period skipped again in March and April. Her daughter returned from college in May. Her May period lasted 5 days and was very heavy. She has urinary frequency and nocturia. Her gynecologist is performing blood work and various tests to screen for ovarian and uterine cancer. Jailey is concerned that tamoxifen could have caused this. She also has various family and life stresses right now.   She continues to teach, and she is on summer break right now. During the summer she plans on helping her daughter and taking her to work at [employer]. For exercise she likes to walk about 1-2 miles once or twice per week. She  denies unusual headaches, visual changes, nausea, vomiting, or dizziness. There has been no unusual cough, phlegm production, or  pleurisy. This been no change in bowel or bladder habits. She denies unexplained fatigue or unexplained weight loss, bleeding, rash, or fever. A detailed review of systems was otherwise stable.   PAST MEDICAL HISTORY: Past Medical History:  Diagnosis Date  . Anemia   . Anxiety   . Breast cancer ([place])   . Complication of anesthesia   . Family history of breast cancer 04/01/2017  . History of radiation therapy 06/09/2017-07/27/2017   right breast was treated to 50.4 Gy in 28 fractions, rightr breast was boosted to 10 Gy in 5 fractions  . Migraines   . Personal history of radiation therapy   . PONV (postoperative nausea and vomiting)    pt needs scope patch    PAST SURGICAL HISTORY: Past Surgical History:  Procedure Laterality Date  . BREAST LUMPECTOMY    . BREAST LUMPECTOMY WITH RADIOACTIVE SEED AND SENTINEL LYMPH NODE BIOPSY Right 04/21/2017   Procedure: BREAST LUMPECTOMY WITH RADIOACTIVE SEED AND SENTINEL LYMPH NODE BIOPSY;  Surgeon: Excell Seltzer, MD;  Location: [facility];  Service: General;  Laterality: Right;  . CLOSED REDUCTION ELBOW DISLOCATION    . DILATION AND CURETTAGE OF UTERUS    . miscarriage    . RE-EXCISION OF BREAST LUMPECTOMY Right 04/29/2017   Procedure: RIGHT RE-EXCISION OF BREAST LUMPECTOMY;  Surgeon: Excell Seltzer, MD;  Location: [facility];  Service: General;  Laterality: Right;  ERAS PATHWAY  . WISDOM TOOTH EXTRACTION      FAMILY HISTORY Family History  Problem Relation Age of Onset  . Fibromyalgia Mother   . Heart failure Mother   . Mental illness Sister   . ADD / ADHD Daughter   . Asthma Son   . ADD / ADHD Son   . Osteoporosis Maternal Grandmother   . Parkinson's disease Maternal Grandfather   . Breast cancer Paternal Grandmother 63       died in 23's  . Stroke Paternal Grandfather   . Cancer Paternal Grandfather 56       abdominal cancer- stomach?Colon?  The patient's parents are both 26 years old as of June  2018. The patient has one brother, one sister. A paternal grandmother was diagnosed with breast cancer in her 47s. A paternal grandfather had "stomach" cancer at an older adult. There is no history of ovarian cancer in the family.  GYNECOLOGIC HISTORY:  No LMP recorded. Menarche age 52, first live birth age 34, she is L[place] P2. She still has regular periods which last 5-7 days of which the first 2 days are heavy. She took oral contraceptives briefly for about a year remotely, stopped because of headaches.  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 She is divorced, at home is just she and her 2 children, [name], studying English at [employer], and eighth grader, as of June 2018. 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    ADVANCED DIRECTIVES: At the 04/01/2017 visit the patient was given the appropriate documents to complete and notarize at her discretion   HEALTH MAINTENANCE: Social History   Tobacco Use  . Smoking status: Never Smoker  . Smokeless tobacco: Never Used  Substance Use Topics  . Alcohol use: No  . Drug use: No     Colonoscopy:Never  PAP:  B[facility] density:[facility], 02/14/2013, T  score -0.6   Allergies  Allergen Reactions  . Penicillins Itching    Current Outpatient Medications  Medication Sig Dispense Refill  . albuterol (ACCUNEB) 0.63 MG/3ML nebulizer solution Take 1 ampule by nebulization every 6 (six) hours as needed for wheezing.    Marland Kitchen amitriptyline (ELAVIL) 25 MG tablet Take 25 mg by mouth daily.  1  . gabapentin (NEURONTIN) 300 MG capsule TAKE 1 CAPSULE BY MOUTH EVERYDAY AT BEDTIME 90 capsule 0  . tamoxifen (NOLVADEX) 20 MG tablet Take 20 mg by mouth daily.     No current facility-administered medications for this visit.     OBJECTIVE: Middle-aged white woman in no acute distress  Vitals:   03/29/18 1448  BP: 117/81  Pulse: 81    Resp: 18  Temp: 98.4 F (36.9 C)  SpO2: 100%     Body mass index is 36.59 kg/m.   Filed Weights   03/29/18 1448  Weight: 219 lb 14.4 oz (99.7 kg)       ECOG FS:1 - Symptomatic but completely ambulatory  Sclerae unicteric, pupils round and equal Oropharynx clear and moist No cervical or supraclavicular adenopathy Lungs no rales or rhonchi Heart regular rate and rhythm Abd soft, nontender, positive bowel sounds MSK no focal spinal tenderness, no upper extremity lymphedema Neuro: nonfocal, well oriented, appropriate affect Breasts: The right breast is status post lumpectomy and radiation.  There is no evidence of local recurrence.  The left breast is benign.  Both axillae are benign.   LAB RESULTS:  CMP     Component Value Date/Time   NA 138 11/09/2017 1452   NA 140 08/05/2017 1553   K 3.5 11/09/2017 1452   K 4.0 08/05/2017 1553   CL 106 11/09/2017 1452   CO2 24 11/09/2017 1452   CO2 21 (L) 08/05/2017 1553   GLUCOSE 68 (L) 11/09/2017 1452   GLUCOSE 76 08/05/2017 1553   BUN 12 11/09/2017 1452   BUN 12.0 08/05/2017 1553   CREATININE 0.75 11/09/2017 1452   CREATININE 0.7 08/05/2017 1553   CALCIUM 8.9 11/09/2017 1452   CALCIUM 9.0 08/05/2017 1553   PROT 7.8 11/09/2017 1452   PROT 7.7 08/05/2017 1553   ALBUMIN 3.6 11/09/2017 1452   ALBUMIN 3.5 08/05/2017 1553   AST 20 11/09/2017 1452   AST 20 08/05/2017 1553   ALT 25 11/09/2017 1452   ALT 22 08/05/2017 1553   ALKPHOS 79 11/09/2017 1452   ALKPHOS 69 08/05/2017 1553   BILITOT 0.3 11/09/2017 1452   BILITOT 0.28 08/05/2017 1553   GFRNONAA >60 11/09/2017 1452   GFRAA >60 11/09/2017 1452    No results found for: TOTALPROTELP, ALBUMINELP, A1GS, A2GS, BETS, BETA2SER, GAMS, MSPIKE, SPEI  No results found for: KPAFRELGTCHN, LAMBDASER, [facility]  Lab Results  Component Value Date   WBC 8.9 03/29/2018   NEUTROABS 6.7 (H) 03/29/2018   HGB 12.2 03/29/2018   HCT 38.3 03/29/2018   MCV 93.2 03/29/2018   PLT 329  03/29/2018      Chemistry      Component Value Date/Time   NA 138 11/09/2017 1452   NA 140 08/05/2017 1553   K 3.5 11/09/2017 1452   K 4.0 08/05/2017 1553   CL 106 11/09/2017 1452   CO2 24 11/09/2017 1452   CO2 21 (L) 08/05/2017 1553   BUN 12 11/09/2017 1452   BUN 12.0 08/05/2017 1553   CREATININE 0.75 11/09/2017 1452   CREATININE 0.7 08/05/2017 1553      Component Value Date/Time   CALCIUM  8.9 11/09/2017 1452   CALCIUM 9.0 08/05/2017 1553   ALKPHOS 79 11/09/2017 1452   ALKPHOS 69 08/05/2017 1553   AST 20 11/09/2017 1452   AST 20 08/05/2017 1553   ALT 25 11/09/2017 1452   ALT 22 08/05/2017 1553   BILITOT 0.3 11/09/2017 1452   BILITOT 0.28 08/05/2017 1553       No results found for: LABCA2  No components found for: KCLEXN170  No results for input(s): INR in the last 168 hours.  Urinalysis    Component Value Date/Time   COLORURINE STRAW (A) 04/13/2017 0757   APPEARANCEUR CLEAR 04/13/2017 0757   LABSPEC 1.002 (L) 04/13/2017 0757   PHURINE 6.0 04/13/2017 0757   GLUCOSEU NEGATIVE 04/13/2017 0757   HGBUR SMALL (A) 04/13/2017 0757   BILIRUBINUR NEGATIVE 04/13/2017 0757   KETONESUR NEGATIVE 04/13/2017 0757   PROTEINUR NEGATIVE 04/13/2017 0757   NITRITE NEGATIVE 04/13/2017 0757   LEUKOCYTESUR NEGATIVE 04/13/2017 0757     STUDIES: Since her last visit, she underwent diagnostic unilateral right breast mammography with CAD and tomography and right breast ultrasonography on 11/13/2017 at [facility] showing: breast density category C. No mammographic or sonographic evidence of malignancy involving the right breast. Benign fat necrosis and an associated oil cyst in the axillary tail of the right breast/low right axilla in the area of patient concern. Interval resolution of the previously identified right axillary seroma. No pathologic right axillary lymphadenopathy. Expected post radiation changes involving the right breast.  She is scheduled for routine bilateral  mammography on 03/30/2018  ELIGIBLE FOR AVAILABLE RESEARCH PROTOCOL: No  ASSESSMENT: 47 y.o. [place] woman status post right breast lower outer quadrant biopsy 03/23/2017 for a clinical T1b N0, stage IA invasive ductal carcinoma, grade 1, estrogen and progesterone receptor positive, HER-2 nonamplified, with an MIB-1 of 5%  (1) genetics testing 04/01/2017 through the"Common Hereditary Cancer Panel",performed at [facility], found no deleterious mutations in  PC, ATM, AXIN2, BARD1, BMPR1A, BRCA1, BRCA2, BRIP1, CDH1, CDKN2A (p14ARF), CDKN2A (p16INK4a), CHEK2, CTNNA1, DICER1, EPCAM (Deletion/duplication testing only), GREM1 (promoter region deletion/duplication testing only), KIT, MEN1, MLH1, MSH2, MSH3, MSH6, MUTYH, NBN, NF1, NHTL1, PALB2, PDGFRA, PMS2, POLD1, POLE, PTEN, RAD50, RAD51C, RAD51D, SDHB, SDHC, SDHD, SMAD4, SMARCA4. STK11, TP53, TSC1, TSC2, and VHL.    (2) Oncotype DX score of 14 predicts a 10 year outside the breast risk of recurrence of 9% if the patient's only systemic therapy is tamoxifen for 5 years. It also predicts no benefit from adjuvant chemotherapy.  (3) right lumpectomy and sentinel lymph node sampling 04/21/2017 showed a pT1b pN0, stage IA invasive ductal carcinoma, grade 1, with a focally present anterior margin for DCIS  (a) margins cleared with additional surgery 04/29/2017  (4) Adjuvant radiation 06/09/2017 to 07/27/2017 Site/dose:    1. The Right breast was treated to 50.4 Gy in 28 fractions of 1.8 Gy per fraction. 2. The Right breast was boosted to 10 Gy in 5 fractions of 2 Gy.   (5) tamoxifen started neoadjuvantly 04/01/2017 continued through surgery and radiation  (a) bone density at GYN's office normal June 2019 PLAN: Shelvia is now close to 1 year out from definitive surgery for her breast cancer with no evidence of disease recurrence.  This is favorable.  She is tolerating tamoxifen well.  However she really needed a refresher course on  the possible side effects, toxicities and complications of this agent as well as a possible benefits.  We spent quite a bit of today's visit going over that.  She now has a better understanding as of why Dr. Perley Jain wants to do salpingo-hysterogram.  She also understands that she is still in the pre-or perimenopausal.  And not ready to switch to an aromatase inhibitor  She is going to see me again in November.  We will consider starting a yearly follow-up from that point.  I strongly encouraged her to exercise on a regular basis.  We also discussed diet issues  I suggest that she give the gabapentin a second try.  She knows to call for any other problems that may develop before the next visit.  Magrinat, Virgie Dad, MD  03/29/18 3:25 PM Medical Oncology and Hematology [facility] [address] [place], [place] [address] Tel. [phone]    Fax. [phone]  Alice Rieger, am acting as scribe for Chauncey Cruel MD.  I, Lurline Del MD, have reviewed the above documentation for accuracy and completeness, and I agree with the above.

## 2018-03-29 ENCOUNTER — Inpatient Hospital Stay: Payer: BC Managed Care – PPO | Attending: Oncology | Admitting: Oncology

## 2018-03-29 ENCOUNTER — Inpatient Hospital Stay: Payer: BC Managed Care – PPO

## 2018-03-29 ENCOUNTER — Telehealth: Payer: Self-pay | Admitting: Oncology

## 2018-03-29 VITALS — BP 117/81 | HR 81 | Temp 98.4°F | Resp 18 | Ht 65.0 in | Wt 219.9 lb

## 2018-03-29 DIAGNOSIS — R350 Frequency of micturition: Secondary | ICD-10-CM

## 2018-03-29 DIAGNOSIS — C50311 Malignant neoplasm of lower-inner quadrant of right female breast: Secondary | ICD-10-CM | POA: Diagnosis present

## 2018-03-29 DIAGNOSIS — Z170 Estrogen receptor positive status [ER+]: Principal | ICD-10-CM

## 2018-03-29 DIAGNOSIS — N951 Menopausal and female climacteric states: Secondary | ICD-10-CM | POA: Diagnosis not present

## 2018-03-29 DIAGNOSIS — N926 Irregular menstruation, unspecified: Secondary | ICD-10-CM | POA: Insufficient documentation

## 2018-03-29 DIAGNOSIS — R351 Nocturia: Secondary | ICD-10-CM | POA: Diagnosis not present

## 2018-03-29 LAB — CBC WITH DIFFERENTIAL (CANCER CENTER ONLY)
BASOS PCT: 0 %
Basophils Absolute: 0 10*3/uL (ref 0.0–0.1)
EOS ABS: 0.1 10*3/uL (ref 0.0–0.5)
Eosinophils Relative: 1 %
HEMATOCRIT: 38.3 % (ref 34.8–46.6)
HEMOGLOBIN: 12.2 g/dL (ref 11.6–15.9)
LYMPHS ABS: 1.7 10*3/uL (ref 0.9–3.3)
Lymphocytes Relative: 19 %
MCH: 29.7 pg (ref 25.1–34.0)
MCHC: 31.9 g/dL (ref 31.5–36.0)
MCV: 93.2 fL (ref 79.5–101.0)
MONO ABS: 0.4 10*3/uL (ref 0.1–0.9)
Monocytes Relative: 5 %
NEUTROS ABS: 6.7 10*3/uL — AB (ref 1.5–6.5)
NEUTROS PCT: 75 %
Platelet Count: 329 10*3/uL (ref 145–400)
RBC: 4.11 MIL/uL (ref 3.70–5.45)
RDW: 12.8 % (ref 11.2–14.5)
WBC Count: 8.9 10*3/uL (ref 3.9–10.3)

## 2018-03-29 LAB — CMP (CANCER CENTER ONLY)
ALT: 16 U/L (ref 0–55)
AST: 18 U/L (ref 5–34)
Albumin: 3.6 g/dL (ref 3.5–5.0)
Alkaline Phosphatase: 79 U/L (ref 40–150)
Anion gap: 8 (ref 3–11)
BUN: 10 mg/dL (ref 7–26)
CHLORIDE: 106 mmol/L (ref 98–109)
CO2: 25 mmol/L (ref 22–29)
CREATININE: 0.79 mg/dL (ref 0.60–1.10)
Calcium: 9.1 mg/dL (ref 8.4–10.4)
GFR, Est AFR Am: >60 mL/min (ref >60)
GLUCOSE: 134 mg/dL (ref 70–140)
Potassium: 3.3 mmol/L — ABNORMAL LOW (ref 3.5–5.1)
Sodium: 139 mmol/L (ref 136–145)
Total Bilirubin: 0.3 mg/dL (ref 0.2–1.2)
Total Protein: 7.5 g/dL (ref 6.4–8.3)

## 2018-03-29 NOTE — Telephone Encounter (Signed)
Gave avs and calendar ° °

## 2018-03-30 ENCOUNTER — Ambulatory Visit
Admission: RE | Admit: 2018-03-30 | Discharge: 2018-03-30 | Disposition: A | Discharge status: Home / Self Care | Payer: BC Managed Care – PPO | Source: Ambulatory Visit | Attending: Obstetrics and Gynecology | Admitting: Obstetrics and Gynecology

## 2018-03-30 DIAGNOSIS — Z853 Personal history of malignant neoplasm of breast: Secondary | ICD-10-CM

## 2018-06-09 ENCOUNTER — Other Ambulatory Visit: Payer: Self-pay | Admitting: Oncology

## 2018-08-27 ENCOUNTER — Other Ambulatory Visit: Payer: Self-pay

## 2018-08-27 DIAGNOSIS — C50311 Malignant neoplasm of lower-inner quadrant of right female breast: Secondary | ICD-10-CM

## 2018-08-27 DIAGNOSIS — Z170 Estrogen receptor positive status [ER+]: Principal | ICD-10-CM

## 2018-08-28 NOTE — Progress Notes (Signed)
[name]  [phone] Fax:([phone]) [phone]     ID: Ruth Nguyen DOB: 11-30-1970  MR#: [id number]  FUX#:[id number]  Patient Care Team: Scifres, Durel Salts as PCP - General (Physician Assistant) Everlene Farrier, MD as Consulting Physician (Obstetrics and Gynecology) Excell Seltzer, MD as Consulting Physician (General Surgery) Magrinat, Virgie Dad, MD as Consulting Physician (Oncology) Gery Pray, MD as Consulting Physician (Radiation Oncology) OTHER MD:  CHIEF COMPLAINT: Estrogen receptor positive breast cancer  CURRENT TREATMENT: Tamoxifen   BREAST CANCER HISTORY: From the original intake note:  Phallon's college roommate recently died from stage IV breast cancer. This was a major event in [name]'s life and it motivated her to start doing breast self exams. She also noted pain in her left breast and she brought this to medical attention. Dr. Gertie Fey set her up for bilateral diagnostic mammography with tomography and bilateral breast ultrasonography at the [facility] 03/19/2017. This showed the breast density to be category B. In the lower inner quadrant of the right breast there was an area of asymmetry which on ultrasound corresponded to an irregular hypoechoic mass at the 4:00 radiant 7 cm from the nipple measuring 0.9 cm. The right axilla was negative by ultrasonography  The left breast by contrast was negative both by mammography and ultrasonography.   Biopsy of the right breast mass in question 03/23/2017 showed ([id number]) and invasive ductal carcinoma, grade 1, estrogen receptor 95% positive, progesterone receptor 100% positive, both with strong staining intensity, with an MIB-1 of 5%, and no HER-2 amplification, the signals ratio being 1.42 and the number per cell 1.85.  The patient's subsequent history is as detailed below.  INTERVAL HISTORY: Nannie returns today for follow-up and treatment of her estrogen receptor positive  breast cancer. She continues on tamoxifen.  She is doing well with hot flashes. Stopped taking Neurotin, hot flashes aren't bad enough to take it.   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  She has sometimes and upset stomach with looser stool. Has tracked her period since 03/24/17 when she was diagnosed with breast cancer and finds she has missed approximately 40% of her periods but is still having a period every 2 months or so.  They tend to be heavier than normal for her  Since her last visit she has had diagnostic mammogram tomo bilaterally at the breast center on 03/30/18 that showed:No mammographic evidence of malignancy in either breast, status post right lumpectomy. ACR Breast Density Category b: There are scattered areas of fibroglandular density.   REVIEW OF SYSTEMS: [name] reports that she is not exercising much at present.  She does have a friend that she walks with at times.  She has poor venous access.  She denies unusual headaches, visual changes, nausea, vomiting, or dizziness. There has been no unusual cough, phlegm production, or pleurisy. This been no change in bowel or bladder habits. She denies unexplained fatigue or unexplained weight loss, bleeding, rash, or fever.  She admits to increasing stress at work.  A detailed review of systems was otherwise stable.  PAST MEDICAL HISTORY: Past Medical History:  Diagnosis Date  . Anemia   . Anxiety   . Breast cancer ([place])   . Complication of anesthesia   . Family history of breast cancer 04/01/2017  . History of radiation therapy 06/09/2017-07/27/2017   right breast was treated to 50.4 Gy in 28 fractions, rightr breast was boosted to 10 Gy in  5 fractions  . Migraines   . Personal history of radiation therapy   . PONV (postoperative nausea and vomiting)    pt needs scope patch    PAST SURGICAL HISTORY: Past Surgical History:  Procedure Laterality  Date  . BREAST LUMPECTOMY    . BREAST LUMPECTOMY WITH RADIOACTIVE SEED AND SENTINEL LYMPH NODE BIOPSY Right 04/21/2017   Procedure: BREAST LUMPECTOMY WITH RADIOACTIVE SEED AND SENTINEL LYMPH NODE BIOPSY;  Surgeon: Excell Seltzer, MD;  Location: [facility];  Service: General;  Laterality: Right;  . CLOSED REDUCTION ELBOW DISLOCATION    . DILATION AND CURETTAGE OF UTERUS    . miscarriage    . RE-EXCISION OF BREAST LUMPECTOMY Right 04/29/2017   Procedure: RIGHT RE-EXCISION OF BREAST LUMPECTOMY;  Surgeon: Excell Seltzer, MD;  Location: [facility];  Service: General;  Laterality: Right;  ERAS PATHWAY  . WISDOM TOOTH EXTRACTION      FAMILY HISTORY Family History  Problem Relation Age of Onset  . Fibromyalgia Mother   . Heart failure Mother   . Mental illness Sister   . ADD / ADHD Daughter   . Asthma Son   . ADD / ADHD Son   . Osteoporosis Maternal Grandmother   . Parkinson's disease Maternal Grandfather   . Breast cancer Paternal Grandmother 52       died in 11's  . Stroke Paternal Grandfather   . Cancer Paternal Grandfather 36       abdominal cancer- stomach?Colon?  The patient's parents are both 34 years old as of June 2018. The patient has one brother, one sister. A paternal grandmother was diagnosed with breast cancer in her 54s. A paternal grandfather had "stomach" cancer at an older adult. There is no history of ovarian cancer in the family.  GYNECOLOGIC HISTORY:  No LMP recorded. Menarche age 73, first live birth age 69, she is [place] P2. She still has regular periods which last 5-7 days of which the first 2 days are heavy. She took oral contraceptives briefly for about a year remotely, stopped because of headaches.  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 She is divorced, at home is just she and her 2 children, [name], studying English at [employer], and  eighth grader, as of June 2018. 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    ADVANCED DIRECTIVES: At the 04/01/2017 visit the patient was given the appropriate documents to complete and notarize at her discretion   HEALTH MAINTENANCE: Social History   Tobacco Use  . Smoking status: Never Smoker  . Smokeless tobacco: Never Used  Substance Use Topics  . Alcohol use: No  . Drug use: No     Colonoscopy:Never  PAP:  Bone density:[facility], 02/14/2013, T score -0.6   Allergies  Allergen Reactions  . Penicillins Itching    Current Outpatient Medications  Medication Sig Dispense Refill  . albuterol (ACCUNEB) 0.63 MG/3ML nebulizer solution Take 1 ampule by nebulization every 6 (six) hours as needed for wheezing.    Marland Kitchen amitriptyline (ELAVIL) 25 MG tablet Take 25 mg by mouth daily.  1  . gabapentin (NEURONTIN) 300 MG capsule TAKE 1 CAPSULE BY MOUTH EVERYDAY AT BEDTIME 90 capsule 0  . tamoxifen (NOLVADEX) 20 MG tablet Take 20 mg by mouth daily.    . tamoxifen (NOLVADEX) 20 MG tablet TAKE 1 TABLET BY MOUTH EVERY DAY 90 tablet 12   No  current facility-administered medications for this visit.     OBJECTIVE: Middle-aged white woman who appears stated age  46:   08/30/18 1546  BP: 125/73  Pulse: 85  Resp: 19  Temp: 98.4 F (36.9 C)  SpO2: 100%     Body mass index is 36.46 kg/m.   Filed Weights   08/30/18 1546  Weight: 219 lb 1.6 oz (99.4 kg)       ECOG FS:1 - Symptomatic but completely ambulatory  Sclerae unicteric, EOMs intact No cervical or supraclavicular adenopathy Lungs no rales or rhonchi Heart regular rate and rhythm Abd soft, obese, nontender, positive bowel sounds MSK no focal spinal tenderness, no upper extremity lymphedema Neuro: nonfocal, well oriented, appropriate affect Breasts: Right breast is status post lumpectomy followed by radiation.  There is no evidence of local recurrence.  The left breast is benign.  Both  axillae are benign.  LAB RESULTS:  CMP     Component Value Date/Time   NA 140 08/30/2018 1454   NA 140 08/05/2017 1553   K 3.7 08/30/2018 1454   K 4.0 08/05/2017 1553   CL 108 08/30/2018 1454   CO2 23 08/30/2018 1454   CO2 21 (L) 08/05/2017 1553   GLUCOSE 106 (H) 08/30/2018 1454   GLUCOSE 76 08/05/2017 1553   BUN 10 08/30/2018 1454   BUN 12.0 08/05/2017 1553   CREATININE 0.78 08/30/2018 1454   CREATININE 0.7 08/05/2017 1553   CALCIUM 9.0 08/30/2018 1454   CALCIUM 9.0 08/05/2017 1553   PROT 7.7 08/30/2018 1454   PROT 7.7 08/05/2017 1553   ALBUMIN 3.5 08/30/2018 1454   ALBUMIN 3.5 08/05/2017 1553   AST 19 08/30/2018 1454   AST 20 08/05/2017 1553   ALT 19 08/30/2018 1454   ALT 22 08/05/2017 1553   ALKPHOS 78 08/30/2018 1454   ALKPHOS 69 08/05/2017 1553   BILITOT <0.2 (L) 08/30/2018 1454   BILITOT 0.28 08/05/2017 1553   GFRNONAA >60 08/30/2018 1454   GFRAA >60 08/30/2018 1454    No results found for: TOTALPROTELP, ALBUMINELP, A1GS, A2GS, BETS, BETA2SER, GAMS, MSPIKE, SPEI  No results found for: Nils Pyle, [facility]  Lab Results  Component Value Date   WBC 7.6 08/30/2018   NEUTROABS 4.9 08/30/2018   HGB 13.3 08/30/2018   HCT 43.1 08/30/2018   MCV 94.1 08/30/2018   PLT 239 08/30/2018      Chemistry      Component Value Date/Time   NA 140 08/30/2018 1454   NA 140 08/05/2017 1553   K 3.7 08/30/2018 1454   K 4.0 08/05/2017 1553   CL 108 08/30/2018 1454   CO2 23 08/30/2018 1454   CO2 21 (L) 08/05/2017 1553   BUN 10 08/30/2018 1454   BUN 12.0 08/05/2017 1553   CREATININE 0.78 08/30/2018 1454   CREATININE 0.7 08/05/2017 1553      Component Value Date/Time   CALCIUM 9.0 08/30/2018 1454   CALCIUM 9.0 08/05/2017 1553   ALKPHOS 78 08/30/2018 1454   ALKPHOS 69 08/05/2017 1553   AST 19 08/30/2018 1454   AST 20 08/05/2017 1553   ALT 19 08/30/2018 1454   ALT 22 08/05/2017 1553   BILITOT <0.2 (L) 08/30/2018 1454   BILITOT 0.28 08/05/2017 1553         No results found for: LABCA2  No components found for: [id number]  No results for input(s): INR in the last 168 hours.  Urinalysis    Component Value Date/Time   COLORURINE STRAW (A) 04/13/2017 5638  APPEARANCEUR CLEAR 04/13/2017 0757   LABSPEC 1.002 (L) 04/13/2017 0757   PHURINE 6.0 04/13/2017 0757   GLUCOSEU NEGATIVE 04/13/2017 0757   HGBUR SMALL (A) 04/13/2017 0757   BILIRUBINUR NEGATIVE 04/13/2017 0757   KETONESUR NEGATIVE 04/13/2017 0757   PROTEINUR NEGATIVE 04/13/2017 0757   NITRITE NEGATIVE 04/13/2017 0757   LEUKOCYTESUR NEGATIVE 04/13/2017 0757     STUDIES: Mammography results discussed with the patient.  Note that she is now breast density category B  ELIGIBLE FOR AVAILABLE RESEARCH PROTOCOL: No  ASSESSMENT: 47 y.o. [place] woman status post right breast lower outer quadrant biopsy 03/23/2017 for a clinical T1b N0, stage IA invasive ductal carcinoma, grade 1, estrogen and progesterone receptor positive, HER-2 nonamplified, with an MIB-1 of 5%  (1) genetics testing 04/01/2017 through the"Common Hereditary Cancer Panel",performed at [facility], found no deleterious mutations in  PC, ATM, AXIN2, BARD1, BMPR1A, BRCA1, BRCA2, BRIP1, CDH1, CDKN2A (p14ARF), CDKN2A (p16INK4a), CHEK2, CTNNA1, DICER1, EPCAM (Deletion/duplication testing only), GREM1 (promoter region deletion/duplication testing only), KIT, MEN1, MLH1, MSH2, MSH3, MSH6, MUTYH, NBN, NF1, NHTL1, PALB2, PDGFRA, PMS2, POLD1, POLE, PTEN, RAD50, RAD51C, RAD51D, SDHB, SDHC, SDHD, SMAD4, SMARCA4. STK11, TP53, TSC1, TSC2, and VHL.    (2) Oncotype DX score of 14 predicts a 10 year outside the breast risk of recurrence of 9% if the patient's only systemic therapy is tamoxifen for 5 years. It also predicts no benefit from adjuvant chemotherapy.  (3) right lumpectomy and sentinel lymph node sampling 04/21/2017 showed a pT1b pN0, stage IA invasive ductal carcinoma, grade 1, with a focally present  anterior margin for DCIS  (a) margins cleared with additional surgery 04/29/2017  (4) Adjuvant radiation 06/09/2017 to 07/27/2017 Site/dose:    1. The Right breast was treated to 50.4 Gy in 28 fractions of 1.8 Gy per fraction. 2. The Right breast was boosted to 10 Gy in 5 fractions of 2 Gy.   (5) tamoxifen started neoadjuvantly 04/01/2017 continued through surgery and radiation  (a) bone density at GYN's office normal June 2019 PLAN: Abbegayle is a little over 1 year out from definitive surgery for breast cancer with no evidence of disease activity.  This is favorable.  She is tolerating tamoxifen well.  I reassured her that tamoxifen is working even if she is still having periods.  It can interrupt the periods but it does not need to do so to work.  In fact her breasts are now density category B, which is likely partly due to the tamoxifen  The plan is to continue tamoxifen a minimum of 5 years.  I strongly encouraged her to resume her earlier exercise program and intensified if possible.  She will have her next mammogram in June.  She will see me again in July.  She knows to call for any other issues that may develop before the next visit.    Magrinat, Virgie Dad, MD  08/30/18 5:27 PM Medical Oncology and Hematology [facility] [address] [place], [place] [address] Tel. [phone]    Fax. [phone]    Elie Goody, am acting as scribe for Dr. Virgie Dad. Magrinat.  I, Lurline Del MD, have reviewed the above documentation for accuracy and completeness, and I agree with the above.

## 2018-08-30 ENCOUNTER — Inpatient Hospital Stay: Payer: BC Managed Care – PPO | Attending: Oncology | Admitting: Oncology

## 2018-08-30 ENCOUNTER — Inpatient Hospital Stay: Payer: BC Managed Care – PPO

## 2018-08-30 ENCOUNTER — Telehealth: Payer: Self-pay | Admitting: Oncology

## 2018-08-30 VITALS — BP 125/73 | HR 85 | Temp 98.4°F | Resp 19 | Ht 65.0 in | Wt 219.1 lb

## 2018-08-30 DIAGNOSIS — N951 Menopausal and female climacteric states: Secondary | ICD-10-CM | POA: Diagnosis not present

## 2018-08-30 DIAGNOSIS — R194 Change in bowel habit: Secondary | ICD-10-CM | POA: Diagnosis not present

## 2018-08-30 DIAGNOSIS — Z170 Estrogen receptor positive status [ER+]: Principal | ICD-10-CM

## 2018-08-30 DIAGNOSIS — C50311 Malignant neoplasm of lower-inner quadrant of right female breast: Secondary | ICD-10-CM | POA: Diagnosis present

## 2018-08-30 LAB — CBC WITH DIFFERENTIAL (CANCER CENTER ONLY)
Abs Immature Granulocytes: 0.01 10*3/uL (ref 0.00–0.07)
BASOS ABS: 0 10*3/uL (ref 0.0–0.1)
Basophils Relative: 0 %
Eosinophils Absolute: 0.1 10*3/uL (ref 0.0–0.5)
Eosinophils Relative: 1 %
HCT: 43.1 % (ref 36.0–46.0)
Hemoglobin: 13.3 g/dL (ref 12.0–15.0)
Immature Granulocytes: 0 %
LYMPHS ABS: 1.9 10*3/uL (ref 0.7–4.0)
LYMPHS PCT: 25 %
MCH: 29 pg (ref 26.0–34.0)
MCHC: 30.9 g/dL (ref 30.0–36.0)
MCV: 94.1 fL (ref 80.0–100.0)
Monocytes Absolute: 0.7 10*3/uL (ref 0.1–1.0)
Monocytes Relative: 9 %
NEUTROS ABS: 4.9 10*3/uL (ref 1.7–7.7)
Neutrophils Relative %: 65 %
Platelet Count: 239 10*3/uL (ref 150–400)
RBC: 4.58 MIL/uL (ref 3.87–5.11)
RDW: 12.5 % (ref 11.5–15.5)
WBC: 7.6 10*3/uL (ref 4.0–10.5)
nRBC: 0 % (ref 0.0–0.2)

## 2018-08-30 LAB — CMP (CANCER CENTER ONLY)
ALT: 19 U/L (ref 0–44)
AST: 19 U/L (ref 15–41)
Albumin: 3.5 g/dL (ref 3.5–5.0)
Alkaline Phosphatase: 78 U/L (ref 38–126)
Anion gap: 9 (ref 5–15)
BUN: 10 mg/dL (ref 6–20)
CHLORIDE: 108 mmol/L (ref 98–111)
CO2: 23 mmol/L (ref 22–32)
Calcium: 9 mg/dL (ref 8.9–10.3)
Creatinine: 0.78 mg/dL (ref 0.44–1.00)
Glucose, Bld: 106 mg/dL — ABNORMAL HIGH (ref 70–99)
POTASSIUM: 3.7 mmol/L (ref 3.5–5.1)
Sodium: 140 mmol/L (ref 135–145)
Total Bilirubin: <0.2 mg/dL — ABNORMAL LOW (ref 0.3–1.2)
Total Protein: 7.7 g/dL (ref 6.5–8.1)

## 2018-08-30 NOTE — Telephone Encounter (Signed)
Gave avs and calendar ° °

## 2019-02-04 ENCOUNTER — Other Ambulatory Visit: Payer: Self-pay | Admitting: Obstetrics and Gynecology

## 2019-02-04 DIAGNOSIS — Z853 Personal history of malignant neoplasm of breast: Secondary | ICD-10-CM

## 2019-04-01 ENCOUNTER — Ambulatory Visit
Admission: RE | Admit: 2019-04-01 | Discharge: 2019-04-01 | Disposition: A | Discharge status: Home / Self Care | Payer: BC Managed Care – PPO | Source: Ambulatory Visit | Attending: Obstetrics and Gynecology | Admitting: Obstetrics and Gynecology

## 2019-04-01 ENCOUNTER — Other Ambulatory Visit: Payer: Self-pay

## 2019-04-01 ENCOUNTER — Other Ambulatory Visit: Payer: Self-pay | Admitting: Obstetrics and Gynecology

## 2019-04-01 DIAGNOSIS — Z853 Personal history of malignant neoplasm of breast: Secondary | ICD-10-CM

## 2019-04-25 ENCOUNTER — Other Ambulatory Visit: Payer: Self-pay | Admitting: *Deleted

## 2019-04-25 DIAGNOSIS — C50311 Malignant neoplasm of lower-inner quadrant of right female breast: Secondary | ICD-10-CM

## 2019-04-26 ENCOUNTER — Inpatient Hospital Stay: Payer: BC Managed Care – PPO | Admitting: Oncology

## 2019-04-26 ENCOUNTER — Inpatient Hospital Stay: Payer: BC Managed Care – PPO | Attending: Oncology

## 2019-04-26 ENCOUNTER — Other Ambulatory Visit: Payer: Self-pay

## 2019-04-26 VITALS — BP 125/91 | HR 100 | Temp 98.6°F | Resp 20 | Wt 225.7 lb

## 2019-04-26 DIAGNOSIS — N951 Menopausal and female climacteric states: Secondary | ICD-10-CM | POA: Diagnosis not present

## 2019-04-26 DIAGNOSIS — C50311 Malignant neoplasm of lower-inner quadrant of right female breast: Secondary | ICD-10-CM

## 2019-04-26 DIAGNOSIS — Z170 Estrogen receptor positive status [ER+]: Secondary | ICD-10-CM | POA: Insufficient documentation

## 2019-04-26 LAB — CMP (CANCER CENTER ONLY)
ALT: 14 U/L (ref 0–44)
AST: 15 U/L (ref 15–41)
Albumin: 3.4 g/dL — ABNORMAL LOW (ref 3.5–5.0)
Alkaline Phosphatase: 86 U/L (ref 38–126)
Anion gap: 9 (ref 5–15)
BUN: 11 mg/dL (ref 6–20)
CO2: 23 mmol/L (ref 22–32)
Calcium: 8.6 mg/dL — ABNORMAL LOW (ref 8.9–10.3)
Chloride: 107 mmol/L (ref 98–111)
Creatinine: 0.75 mg/dL (ref 0.44–1.00)
GFR, Est AFR Am: >60 mL/min (ref >60)
GFR, Estimated: >60 mL/min (ref >60)
Glucose, Bld: 115 mg/dL — ABNORMAL HIGH (ref 70–99)
Potassium: 3.8 mmol/L (ref 3.5–5.1)
Sodium: 139 mmol/L (ref 135–145)
Total Bilirubin: 0.3 mg/dL (ref 0.3–1.2)
Total Protein: 7.5 g/dL (ref 6.5–8.1)

## 2019-04-26 LAB — CBC WITH DIFFERENTIAL (CANCER CENTER ONLY)
Abs Immature Granulocytes: 0.01 10*3/uL (ref 0.00–0.07)
Basophils Absolute: 0 10*3/uL (ref 0.0–0.1)
Basophils Relative: 0 %
Eosinophils Absolute: 0.1 10*3/uL (ref 0.0–0.5)
Eosinophils Relative: 2 %
HCT: 39.7 % (ref 36.0–46.0)
Hemoglobin: 12.7 g/dL (ref 12.0–15.0)
Immature Granulocytes: 0 %
Lymphocytes Relative: 33 %
Lymphs Abs: 2.1 10*3/uL (ref 0.7–4.0)
MCH: 29.4 pg (ref 26.0–34.0)
MCHC: 32 g/dL (ref 30.0–36.0)
MCV: 91.9 fL (ref 80.0–100.0)
Monocytes Absolute: 0.6 10*3/uL (ref 0.1–1.0)
Monocytes Relative: 10 %
Neutro Abs: 3.5 10*3/uL (ref 1.7–7.7)
Neutrophils Relative %: 55 %
Platelet Count: 293 10*3/uL (ref 150–400)
RBC: 4.32 MIL/uL (ref 3.87–5.11)
RDW: 12.2 % (ref 11.5–15.5)
WBC Count: 6.3 10*3/uL (ref 4.0–10.5)
nRBC: 0 % (ref 0.0–0.2)

## 2019-04-26 NOTE — Progress Notes (Signed)
[place]  [phone] [phone]     ID: Ruth Nguyen DOB: 04/16/1971  MR#: [id number]  IDP#:[id number]  Patient Care Team: Ruth Nguyen, Ruth Nguyen as PCP - General (Physician Assistant) Ruth Farrier, MD as Consulting Physician (Obstetrics and Gynecology) Ruth Seltzer, MD as Consulting Physician (General Surgery) Ruth Nguyen, Ruth Dad, MD as Consulting Physician (Oncology) Ruth Pray, MD as Consulting Physician (Radiation Oncology) OTHER MD:  CHIEF COMPLAINT: Estrogen receptor positive breast cancer  CURRENT TREATMENT: Tamoxifen   INTERVAL HISTORY: Ruth Nguyen returns today for follow-up and treatment of her estrogen receptor positive breast cancer.   She continues on tamoxifen. She reports daily hot flashes, randomly 2-3 times a day.  Since her last visit, she presented for her annual mammogram and noted tenderness and a palpable lump in the lower-inner right breast. She notes she is unsure if it has been present since surgery or has definitely felt different. She underwent bilateral diagnostic mammography with tomography and right breast ultrasound at [facility] on 04/01/2019 showing: breast density category B; no suspicious mammographic or targeted sonographic abnormalities at the palpable site of concern in the lower-inner right breast; no evidence of malignancy in either breast.   REVIEW OF SYSTEMS: Ruth Nguyen reports her sleep is "disturbed" right now; she wakes up, tosses and turns, and generally just doesn't sleep well. 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 Her son, a rising sophomore, has breathing issues and ADHD, as well as learning issues. A detailed review of systems was otherwise stable.    BREAST CANCER HISTORY: From the original intake note:  Ruth Nguyen's college  roommate recently died from stage IV breast cancer. This was a major event in Ruth Nguyen's life and it motivated her to start doing breast self exams. She also noted pain in her left breast and she brought this to medical attention. Dr. Gertie Nguyen set her up for bilateral diagnostic mammography with tomography and bilateral breast ultrasonography at the [facility] 03/19/2017. This showed the breast density to be category B. In the lower inner quadrant of the right breast there was an area of asymmetry which on ultrasound corresponded to an irregular hypoechoic mass at the 4:00 radiant 7 cm from the nipple measuring 0.9 cm. The right axilla was negative by ultrasonography  The left breast by contrast was negative both by mammography and ultrasonography.   Biopsy of the right breast mass in question 03/23/2017 showed ([id number]) and invasive ductal carcinoma, grade 1, estrogen receptor 95% positive, progesterone receptor 100% positive, both with strong staining intensity, with an MIB-1 of 5%, and no HER-2 amplification, the signals ratio being 1.42 and the number per cell 1.85.  The patient's subsequent history is as detailed below.   PAST MEDICAL HISTORY: Past Medical History:  Diagnosis Date  . Anemia   . Anxiety   . Breast cancer ([place])   . Complication of anesthesia   . Family history of breast cancer 04/01/2017  . History of radiation therapy 06/09/2017-07/27/2017   right breast was treated to 50.4 Gy in 28 fractions, rightr breast was boosted to 10 Gy in 5 fractions  . Migraines   . Personal history of radiation therapy   . PONV (postoperative nausea and vomiting)    pt needs scope patch    PAST SURGICAL HISTORY: Past Surgical History:  Procedure Laterality Date  .  BREAST LUMPECTOMY    . BREAST LUMPECTOMY WITH RADIOACTIVE SEED AND SENTINEL LYMPH NODE BIOPSY Right 04/21/2017   Procedure: BREAST LUMPECTOMY WITH RADIOACTIVE SEED AND SENTINEL LYMPH NODE BIOPSY;  Surgeon: Ruth Seltzer,  MD;  Location: [facility];  Service: General;  Laterality: Right;  . CLOSED REDUCTION ELBOW DISLOCATION    . DILATION AND CURETTAGE OF UTERUS    . miscarriage    . RE-EXCISION OF BREAST LUMPECTOMY Right 04/29/2017   Procedure: RIGHT RE-EXCISION OF BREAST LUMPECTOMY;  Surgeon: Ruth Seltzer, MD;  Location: [facility];  Service: General;  Laterality: Right;  ERAS PATHWAY  . WISDOM TOOTH EXTRACTION      FAMILY HISTORY Family History  Problem Relation Age of Onset  . Fibromyalgia Mother   . Heart failure Mother   . Mental illness Sister   . ADD / ADHD Daughter   . Asthma Son   . ADD / ADHD Son   . Osteoporosis Maternal Grandmother   . Parkinson's disease Maternal Grandfather   . Breast cancer Paternal Grandmother 40       died in 12's  . Stroke Paternal Grandfather   . Cancer Paternal Grandfather 37       abdominal cancer- stomach?Colon?  The patient's parents are both 82 years old as of June 2018. The patient has one brother, one sister. A paternal grandmother was diagnosed with breast cancer in her 68s. A paternal grandfather had "stomach" cancer at an older adult. There is no history of ovarian cancer in the family.   GYNECOLOGIC HISTORY:  No LMP recorded. Menarche age 75, first live birth age 71, she is [place] P2. She still has regular periods which last 5-7 days of which the first 2 days are heavy. She took oral contraceptives briefly for about a year remotely, stopped because of headaches.   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 She is divorced, at home is just she and her 2 children, Ruth Nguyen, studying English at [employer], an eighth grader, as of June 2018. 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    ADVANCED DIRECTIVES: At the 04/01/2017 visit the patient was given the appropriate documents to complete and notarize at her discretion    HEALTH MAINTENANCE: Social History   Tobacco Use  . Smoking status: Never Smoker  . Smokeless tobacco: Never Used  Substance Use Topics  . Alcohol use: No  . Drug use: No     Colonoscopy:Never  PAP:  Bone density:[facility], 02/14/2013, T score -0.6   Allergies  Allergen Reactions  . Penicillins Itching    Current Outpatient Medications  Medication Sig Dispense Refill  . albuterol (ACCUNEB) 0.63 MG/3ML nebulizer solution Take 1 ampule by nebulization every 6 (six) hours as needed for wheezing.    Marland Kitchen amitriptyline (ELAVIL) 25 MG tablet Take 25 mg by mouth daily.  1  . gabapentin (NEURONTIN) 300 MG capsule TAKE 1 CAPSULE BY MOUTH EVERYDAY AT BEDTIME 90 capsule 0  . tamoxifen (NOLVADEX) 20 MG tablet Take 20 mg by mouth daily.    . tamoxifen (NOLVADEX) 20 MG tablet TAKE 1 TABLET BY MOUTH EVERY DAY 90 tablet 12   No current facility-administered medications for this visit.     OBJECTIVE: Middle-aged white woman in no acute distress  Vitals:   04/26/19 1150  BP: (!) 125/91  Pulse: 100  Resp: 20  Temp: 98.6 F (37 C)  SpO2: 98%     Body mass index is 37.56 kg/m.   Filed Weights   04/26/19 1150  Weight: 225 lb 11.2 oz (102.4 kg)       ECOG FS:1 - Symptomatic but completely ambulatory  Sclerae unicteric, pupils round and equal Wearing a mask No cervical or supraclavicular adenopathy Lungs no rales or rhonchi Heart regular rate and rhythm Abd soft, nontender, positive bowel sounds MSK no focal spinal tenderness, no upper extremity lymphedema Neuro: nonfocal, well oriented, appropriate affect Breasts: The right breast is status post lumpectomy and radiation.  There is no evidence of disease recurrence.  The cosmetic result is good.  The left breast is benign.  Both axillae are benign.  LAB RESULTS:  CMP     Component Value Date/Time   NA 139 04/26/2019 1106   NA 140 08/05/2017 1553   K 3.8 04/26/2019 1106   K 4.0 08/05/2017 1553   CL 107  04/26/2019 1106   CO2 23 04/26/2019 1106   CO2 21 (L) 08/05/2017 1553   GLUCOSE 115 (H) 04/26/2019 1106   GLUCOSE 76 08/05/2017 1553   BUN 11 04/26/2019 1106   BUN 12.0 08/05/2017 1553   CREATININE 0.75 04/26/2019 1106   CREATININE 0.7 08/05/2017 1553   CALCIUM 8.6 (L) 04/26/2019 1106   CALCIUM 9.0 08/05/2017 1553   PROT 7.5 04/26/2019 1106   PROT 7.7 08/05/2017 1553   ALBUMIN 3.4 (L) 04/26/2019 1106   ALBUMIN 3.5 08/05/2017 1553   AST 15 04/26/2019 1106   AST 20 08/05/2017 1553   ALT 14 04/26/2019 1106   ALT 22 08/05/2017 1553   ALKPHOS 86 04/26/2019 1106   ALKPHOS 69 08/05/2017 1553   BILITOT 0.3 04/26/2019 1106   BILITOT 0.28 08/05/2017 1553   GFRNONAA >60 04/26/2019 1106   GFRAA >60 04/26/2019 1106    No results found for: TOTALPROTELP, ALBUMINELP, A1GS, A2GS, BETS, BETA2SER, GAMS, MSPIKE, SPEI  No results found for: Nils Pyle, [facility]  Lab Results  Component Value Date   WBC 6.3 04/26/2019   NEUTROABS 3.5 04/26/2019   HGB 12.7 04/26/2019   HCT 39.7 04/26/2019   MCV 91.9 04/26/2019   PLT 293 04/26/2019      Chemistry      Component Value Date/Time   NA 139 04/26/2019 1106   NA 140 08/05/2017 1553   K 3.8 04/26/2019 1106   K 4.0 08/05/2017 1553   CL 107 04/26/2019 1106   CO2 23 04/26/2019 1106   CO2 21 (L) 08/05/2017 1553   BUN 11 04/26/2019 1106   BUN 12.0 08/05/2017 1553   CREATININE 0.75 04/26/2019 1106   CREATININE 0.7 08/05/2017 1553      Component Value Date/Time   CALCIUM 8.6 (L) 04/26/2019 1106   CALCIUM 9.0 08/05/2017 1553   ALKPHOS 86 04/26/2019 1106   ALKPHOS 69 08/05/2017 1553   AST 15 04/26/2019 1106   AST 20 08/05/2017 1553   ALT 14 04/26/2019 1106   ALT 22 08/05/2017 1553   BILITOT 0.3 04/26/2019 1106   BILITOT 0.28 08/05/2017 1553       No results found for: LABCA2  No components found for: CZYSAY301  No results for input(s): INR in the last 168 hours.  Urinalysis    Component Value Date/Time    COLORURINE STRAW (A) 04/13/2017 0757   APPEARANCEUR CLEAR 04/13/2017 0757   LABSPEC 1.002 (L) 04/13/2017 0757   PHURINE 6.0 04/13/2017 0757   GLUCOSEU NEGATIVE 04/13/2017 0757   HGBUR SMALL (A) 04/13/2017 0757  BILIRUBINUR NEGATIVE 04/13/2017 [place] 04/13/2017 0757   PROTEINUR NEGATIVE 04/13/2017 0757   NITRITE NEGATIVE 04/13/2017 0757   LEUKOCYTESUR NEGATIVE 04/13/2017 0757     STUDIES: US Breast Ltd Uni Right Inc Axilla  Result Date: 04/01/2019 CLINICAL DATA:  48 year old female presenting for routine annual surveillance status post right breast lumpectomy in 2018. She has felt tenderness and a palpable lump in the lower-inner right breast. She states that this has felt possibly different over time, but may have been present since surgery. EXAM: DIGITAL DIAGNOSTIC BILATERAL MAMMOGRAM WITH CAD AND TOMO ULTRASOUND RIGHT BREAST COMPARISON:  Previous exam(s). ACR Breast Density Category b: There are scattered areas of fibroglandular density. FINDINGS: The right breast lumpectomy site is stable. No suspicious calcifications, masses or areas of distortion are seen in the bilateral breasts. Mammographic images were processed with CAD. The palpable lump described by the patient is in the far medial aspect of the right breast, superior to her incisional scar. No suspicious palpable masses are identified at this site. Physical exam down to the area of her surgical scar reveals no suspicious palpable lumps. Ultrasound of the palpable site in the right breast at 3 o'clock (labeled 4 o'clock, 10 cm palpable area) from the nipple demonstrates normal fibroglandular tissue. The lumpectomy scar is at about 4 o'clock, 10 cm from the nipple in the right breast. Shadowing from the surgical scar is seen originating from the skin, however no suspicious targeted sonographic findings are identified surrounding the surgical scar. IMPRESSION: 1. There are no suspicious mammographic or targeted  sonographic abnormalities at the palpable site of concern in the lower-inner right breast. 2.  No mammographic evidence of malignancy in the bilateral breasts. RECOMMENDATION: 1. Clinical follow-up recommended for the palpable area of concern in the right breast at her lumpectomy site. Any further workup should be based on clinical grounds. 2.  Diagnostic mammogram is suggested in 1 year. (Code:DM-B-01Y) I have discussed the findings and recommendations with the patient. Results were also provided in writing at the conclusion of the visit. If applicable, a reminder letter will be sent to the patient regarding the next appointment. BI-RADS CATEGORY  2: Benign. Electronically Signed   By: Ammie Ferrier M.D.   On: 04/01/2019 12:01   Mm Diag Breast Tomo Bilateral  Result Date: 04/01/2019 CLINICAL DATA:  48 year old female presenting for routine annual surveillance status post right breast lumpectomy in 2018. She has felt tenderness and a palpable lump in the lower-inner right breast. She states that this has felt possibly different over time, but may have been present since surgery. EXAM: DIGITAL DIAGNOSTIC BILATERAL MAMMOGRAM WITH CAD AND TOMO ULTRASOUND RIGHT BREAST COMPARISON:  Previous exam(s). ACR Breast Density Category b: There are scattered areas of fibroglandular density. FINDINGS: The right breast lumpectomy site is stable. No suspicious calcifications, masses or areas of distortion are seen in the bilateral breasts. Mammographic images were processed with CAD. The palpable lump described by the patient is in the far medial aspect of the right breast, superior to her incisional scar. No suspicious palpable masses are identified at this site. Physical exam down to the area of her surgical scar reveals no suspicious palpable lumps. Ultrasound of the palpable site in the right breast at 3 o'clock (labeled 4 o'clock, 10 cm palpable area) from the nipple demonstrates normal fibroglandular tissue. The  lumpectomy scar is at about 4 o'clock, 10 cm from the nipple in the right breast. Shadowing from the surgical scar is seen originating from  the skin, however no suspicious targeted sonographic findings are identified surrounding the surgical scar. IMPRESSION: 1. There are no suspicious mammographic or targeted sonographic abnormalities at the palpable site of concern in the lower-inner right breast. 2.  No mammographic evidence of malignancy in the bilateral breasts. RECOMMENDATION: 1. Clinical follow-up recommended for the palpable area of concern in the right breast at her lumpectomy site. Any further workup should be based on clinical grounds. 2.  Diagnostic mammogram is suggested in 1 year. (Code:DM-B-01Y) I have discussed the findings and recommendations with the patient. Results were also provided in writing at the conclusion of the visit. If applicable, a reminder letter will be sent to the patient regarding the next appointment. BI-RADS CATEGORY  2: Benign. Electronically Signed   By: Ammie Ferrier M.D.   On: 04/01/2019 12:01     ELIGIBLE FOR AVAILABLE RESEARCH PROTOCOL: No  ASSESSMENT: 48 y.o. [place] woman status post right breast lower outer quadrant biopsy 03/23/2017 for a clinical T1b N0, stage IA invasive ductal carcinoma, grade 1, estrogen and progesterone receptor positive, HER-2 nonamplified, with an MIB-1 of 5%  (1) genetics testing 04/01/2017 through the"Common Hereditary Cancer Panel",performed at [facility], found no deleterious mutations in  PC, ATM, AXIN2, BARD1, BMPR1A, BRCA1, BRCA2, BRIP1, CDH1, CDKN2A (p14ARF), CDKN2A (p16INK4a), CHEK2, CTNNA1, DICER1, EPCAM (Deletion/duplication testing only), GREM1 (promoter region deletion/duplication testing only), KIT, MEN1, MLH1, MSH2, MSH3, MSH6, MUTYH, NBN, NF1, NHTL1, PALB2, PDGFRA, PMS2, POLD1, POLE, PTEN, RAD50, RAD51C, RAD51D, SDHB, SDHC, SDHD, SMAD4, SMARCA4. STK11, TP53, TSC1, TSC2, and VHL.    (2) Oncotype  DX score of 14 predicts a 10 year outside the breast risk of recurrence of 9% if the patient's only systemic therapy is tamoxifen for 5 years. It also predicts no benefit from adjuvant chemotherapy.  (3) right lumpectomy and sentinel lymph node sampling 04/21/2017 showed a pT1b pN0, stage IA invasive ductal carcinoma, grade 1, with a focally present anterior margin for DCIS  (a) margins cleared with additional surgery 04/29/2017  (4) Adjuvant radiation 06/09/2017 to 07/27/2017 Site/dose:    1. The Right breast was treated to 50.4 Gy in 28 fractions of 1.8 Gy per fraction. 2. The Right breast was boosted to 10 Gy in 5 fractions of 2 Gy.   (5) tamoxifen started neoadjuvantly 04/01/2017 continued through surgery and radiation  (a) bone density at GYN's office normal June 2019 PLAN: Eran is now 2 years out from definitive surgery for her breast cancer with no evidence of disease recurrence.  This is very favorable.  She is tolerating tamoxifen generally well and the plan will be to continue that a total of 5 years.  She is having some trouble with her iron supplementation.  I suggested she just take 1 tablet a day with food.  She should be able to tolerate that.  In any case at present her hemoglobin is normal  She will have her next set of mammography in June and she will see me again a year from now  She knows to call for any other issue that may develop before the next visit.  Dejion Grillo, Ruth Dad, MD  04/26/19 12:30 PM Medical Oncology and Hematology [facility] [address] [place], [place] [address] Tel. [phone]    Fax. [phone]    I, Wilburn Mylar, am acting as scribe for Dr. Virgie Nguyen. Jayshawn Colston.  I, Lurline Del MD, have reviewed the above documentation for accuracy and completeness, and I agree with the above.

## 2019-04-28 ENCOUNTER — Telehealth: Payer: Self-pay | Admitting: Oncology

## 2019-04-28 NOTE — Telephone Encounter (Signed)
I talk with patient regarding schedule  

## 2019-08-29 ENCOUNTER — Other Ambulatory Visit: Payer: Self-pay | Admitting: Oncology

## 2020-01-16 ENCOUNTER — Other Ambulatory Visit: Payer: Self-pay

## 2020-01-16 ENCOUNTER — Other Ambulatory Visit: Payer: Self-pay | Admitting: Family Medicine

## 2020-01-16 ENCOUNTER — Ambulatory Visit
Admission: RE | Admit: 2020-01-16 | Discharge: 2020-01-16 | Disposition: A | Discharge status: Home / Self Care | Payer: BC Managed Care – PPO | Source: Ambulatory Visit | Attending: Family Medicine | Admitting: Family Medicine

## 2020-01-16 DIAGNOSIS — M79671 Pain in right foot: Secondary | ICD-10-CM

## 2020-02-02 ENCOUNTER — Other Ambulatory Visit: Payer: Self-pay

## 2020-02-02 ENCOUNTER — Ambulatory Visit
Admission: RE | Admit: 2020-02-02 | Discharge: 2020-02-02 | Disposition: A | Discharge status: Home / Self Care | Payer: BC Managed Care – PPO | Source: Ambulatory Visit | Attending: Physician Assistant | Admitting: Physician Assistant

## 2020-02-02 ENCOUNTER — Other Ambulatory Visit: Payer: Self-pay | Admitting: Physician Assistant

## 2020-02-02 DIAGNOSIS — R0602 Shortness of breath: Secondary | ICD-10-CM

## 2020-02-21 ENCOUNTER — Other Ambulatory Visit: Payer: Self-pay | Admitting: Obstetrics and Gynecology

## 2020-02-21 DIAGNOSIS — Z853 Personal history of malignant neoplasm of breast: Secondary | ICD-10-CM

## 2020-03-20 ENCOUNTER — Other Ambulatory Visit: Payer: Self-pay | Admitting: Obstetrics and Gynecology

## 2020-03-20 DIAGNOSIS — R2232 Localized swelling, mass and lump, left upper limb: Secondary | ICD-10-CM

## 2020-03-20 DIAGNOSIS — Z853 Personal history of malignant neoplasm of breast: Secondary | ICD-10-CM

## 2020-04-02 ENCOUNTER — Other Ambulatory Visit: Payer: Self-pay

## 2020-04-02 ENCOUNTER — Ambulatory Visit
Admission: RE | Admit: 2020-04-02 | Discharge: 2020-04-02 | Disposition: A | Discharge status: Home / Self Care | Payer: BC Managed Care – PPO | Source: Ambulatory Visit | Attending: Obstetrics and Gynecology | Admitting: Obstetrics and Gynecology

## 2020-04-02 DIAGNOSIS — R2232 Localized swelling, mass and lump, left upper limb: Secondary | ICD-10-CM

## 2020-04-02 DIAGNOSIS — Z853 Personal history of malignant neoplasm of breast: Secondary | ICD-10-CM

## 2020-04-25 ENCOUNTER — Other Ambulatory Visit: Payer: Self-pay | Admitting: *Deleted

## 2020-04-25 DIAGNOSIS — Z170 Estrogen receptor positive status [ER+]: Secondary | ICD-10-CM

## 2020-04-26 ENCOUNTER — Ambulatory Visit: Payer: BC Managed Care – PPO | Admitting: Oncology

## 2020-04-26 ENCOUNTER — Other Ambulatory Visit: Payer: Self-pay

## 2020-04-26 ENCOUNTER — Inpatient Hospital Stay (HOSPITAL_BASED_OUTPATIENT_CLINIC_OR_DEPARTMENT_OTHER): Payer: BC Managed Care – PPO | Admitting: Oncology

## 2020-04-26 ENCOUNTER — Other Ambulatory Visit: Payer: BC Managed Care – PPO

## 2020-04-26 ENCOUNTER — Telehealth: Payer: Self-pay | Admitting: Oncology

## 2020-04-26 ENCOUNTER — Inpatient Hospital Stay: Payer: BC Managed Care – PPO | Attending: Oncology

## 2020-04-26 VITALS — BP 136/75 | HR 88 | Temp 98.7°F | Resp 20 | Ht 65.0 in | Wt 237.6 lb

## 2020-04-26 DIAGNOSIS — Z170 Estrogen receptor positive status [ER+]: Secondary | ICD-10-CM

## 2020-04-26 DIAGNOSIS — N951 Menopausal and female climacteric states: Secondary | ICD-10-CM | POA: Diagnosis not present

## 2020-04-26 DIAGNOSIS — C50311 Malignant neoplasm of lower-inner quadrant of right female breast: Secondary | ICD-10-CM

## 2020-04-26 DIAGNOSIS — C50511 Malignant neoplasm of lower-outer quadrant of right female breast: Secondary | ICD-10-CM | POA: Insufficient documentation

## 2020-04-26 LAB — CBC WITH DIFFERENTIAL (CANCER CENTER ONLY)
Abs Immature Granulocytes: 0.01 10*3/uL (ref 0.00–0.07)
Basophils Absolute: 0 10*3/uL (ref 0.0–0.1)
Basophils Relative: 0 %
Eosinophils Absolute: 0.1 10*3/uL (ref 0.0–0.5)
Eosinophils Relative: 2 %
HCT: 39 % (ref 36.0–46.0)
Hemoglobin: 12.3 g/dL (ref 12.0–15.0)
Immature Granulocytes: 0 %
Lymphocytes Relative: 36 %
Lymphs Abs: 2 10*3/uL (ref 0.7–4.0)
MCH: 29.4 pg (ref 26.0–34.0)
MCHC: 31.5 g/dL (ref 30.0–36.0)
MCV: 93.3 fL (ref 80.0–100.0)
Monocytes Absolute: 0.5 10*3/uL (ref 0.1–1.0)
Monocytes Relative: 9 %
Neutro Abs: 2.9 10*3/uL (ref 1.7–7.7)
Neutrophils Relative %: 53 %
Platelet Count: 280 10*3/uL (ref 150–400)
RBC: 4.18 MIL/uL (ref 3.87–5.11)
RDW: 12.5 % (ref 11.5–15.5)
WBC Count: 5.6 10*3/uL (ref 4.0–10.5)
nRBC: 0 % (ref 0.0–0.2)

## 2020-04-26 LAB — CMP (CANCER CENTER ONLY)
ALT: 17 U/L (ref 0–44)
AST: 18 U/L (ref 15–41)
Albumin: 3.3 g/dL — ABNORMAL LOW (ref 3.5–5.0)
Alkaline Phosphatase: 97 U/L (ref 38–126)
Anion gap: 8 (ref 5–15)
BUN: 12 mg/dL (ref 6–20)
CO2: 26 mmol/L (ref 22–32)
Calcium: 8.8 mg/dL — ABNORMAL LOW (ref 8.9–10.3)
Chloride: 105 mmol/L (ref 98–111)
Creatinine: 0.75 mg/dL (ref 0.44–1.00)
GFR, Est AFR Am: >60 mL/min (ref >60)
GFR, Estimated: >60 mL/min (ref >60)
Glucose, Bld: 116 mg/dL — ABNORMAL HIGH (ref 70–99)
Potassium: 3.8 mmol/L (ref 3.5–5.1)
Sodium: 139 mmol/L (ref 135–145)
Total Bilirubin: 0.3 mg/dL (ref 0.3–1.2)
Total Protein: 7.4 g/dL (ref 6.5–8.1)

## 2020-04-26 MED ORDER — TAMOXIFEN CITRATE 20 MG PO TABS: 20.0000 mg | ORAL_TABLET | Freq: Every day | ORAL | 4 refills | Status: DC

## 2020-04-26 MED ORDER — GABAPENTIN 300 MG PO CAPS: ORAL_CAPSULE | ORAL | 4 refills | Status: DC

## 2020-04-26 NOTE — Progress Notes (Signed)
[place]  [phone] [phone]     ID: Ruth Nguyen DOB: [birth year]  MR#: [id number]  VOH#:[id number]  Patient Care Team: Scifres, Durel Salts as PCP - General (Physician Assistant) Everlene Farrier, MD as Consulting Physician (Obstetrics and Gynecology) Excell Seltzer, MD (Inactive) as Consulting Physician (General Surgery) Kashira Behunin, Virgie Dad, MD as Consulting Physician (Oncology) Gery Pray, MD as Consulting Physician (Radiation Oncology) OTHER MD:  CHIEF COMPLAINT: Estrogen receptor positive breast cancer  CURRENT TREATMENT: Tamoxifen   INTERVAL HISTORY: Ruth Nguyen returns today for follow-up and treatment of her estrogen receptor positive breast cancer. She was last seen here on 04/26/2019.   She continues on tamoxifen.  She has hot flashes both daytime and nighttime.  They tend to come unannounced.  They can cause her to sweat.  Since her last visit here, she underwent digital diagnostic bilateral mammogram with left axilla ultrasound for a palpable lump in the left axilla for 12 weeks since her COVID-19 vaccine. Results revealed no mammographic evidence of malignancy in either breast. Morphologically normal left axillary lymph node corresponding with the patient's palpable lump.   REVIEW OF SYSTEMS: Ruth Nguyen is enjoying her summer and spending a lot of time driving her children around.  Both of them have jobs and neither of them can drive.  She is not exercising regularly.  A detailed review of systems today was otherwise stable  BREAST CANCER HISTORY: From the original intake note:  Eyvonne's college roommate recently died from stage IV breast cancer. This was a major event in Tamberly's life and it motivated her to start doing breast self exams. She also noted pain in her left breast and she brought this to medical attention. Dr. Gertie Fey set her up for bilateral diagnostic mammography with tomography and bilateral breast  ultrasonography at the [facility] 03/19/2017. This showed the breast density to be category B. In the lower inner quadrant of the right breast there was an area of asymmetry which on ultrasound corresponded to an irregular hypoechoic mass at the 4:00 radiant 7 cm from the nipple measuring 0.9 cm. The right axilla was negative by ultrasonography  The left breast by contrast was negative both by mammography and ultrasonography.   Biopsy of the right breast mass in question 03/23/2017 showed ([id number]) and invasive ductal carcinoma, grade 1, estrogen receptor 95% positive, progesterone receptor 100% positive, both with strong staining intensity, with an MIB-1 of 5%, and no HER-2 amplification, the signals ratio being 1.42 and the number per cell 1.85.  The patient's subsequent history is as detailed below.   PAST MEDICAL HISTORY: Past Medical History:  Diagnosis Date  . Anemia   . Anxiety   . Breast cancer ([place])   . Complication of anesthesia   . Family history of breast cancer 04/01/2017  . History of radiation therapy 06/09/2017-07/27/2017   right breast was treated to 50.4 Gy in 28 fractions, rightr breast was boosted to 10 Gy in 5 fractions  . Migraines   . Personal history of radiation therapy   . PONV (postoperative nausea and vomiting)    pt needs scope patch    PAST SURGICAL HISTORY: Past Surgical History:  Procedure Laterality Date  . BREAST BIOPSY Right 03/2017  . BREAST LUMPECTOMY Right 05/2017  . BREAST LUMPECTOMY WITH RADIOACTIVE SEED AND SENTINEL LYMPH NODE BIOPSY Right 04/21/2017   Procedure: BREAST LUMPECTOMY WITH RADIOACTIVE SEED AND SENTINEL LYMPH NODE BIOPSY;  Surgeon: Excell Seltzer, MD;  Location: [facility];  Service: General;  Laterality: Right;  . CLOSED REDUCTION ELBOW DISLOCATION    . DILATION AND CURETTAGE OF UTERUS    . miscarriage    . RE-EXCISION OF BREAST LUMPECTOMY Right 04/29/2017   Procedure: RIGHT RE-EXCISION OF BREAST  LUMPECTOMY;  Surgeon: Excell Seltzer, MD;  Location: [facility];  Service: General;  Laterality: Right;  ERAS PATHWAY  . WISDOM TOOTH EXTRACTION      FAMILY HISTORY Family History  Problem Relation Age of Onset  . Fibromyalgia Mother   . Heart failure Mother   . Mental illness Sister   . ADD / ADHD Daughter   . Asthma Son   . ADD / ADHD Son   . Osteoporosis Maternal Grandmother   . Parkinson's disease Maternal Grandfather   . Breast cancer Paternal Grandmother 10       died in 13's  . Stroke Paternal Grandfather   . Cancer Paternal Grandfather 34       abdominal cancer- stomach?Colon?  The patient's parents are both 32 years old as of June 2018. The patient has one brother, one sister. A paternal grandmother was diagnosed with breast cancer in her 76s. A paternal grandfather had "stomach" cancer at an older adult. There is no history of ovarian cancer in the family.   GYNECOLOGIC HISTORY:  No LMP recorded. (Menstrual status: Irregular Periods). Menarche age 11, first live birth age 39, she is [place] P2. She still has regular periods which last 5-7 days of which the first 2 days are heavy. She took oral contraceptives briefly for about a year remotely, stopped because of headaches.   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 She is divorced, at home is just she and her 2 children, Ruth Nguyen, studying English at [employer], an eighth grader, as of June 2018. 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    ADVANCED DIRECTIVES: At the 04/01/2017 visit the patient was given the appropriate documents to complete and notarize at her discretion   HEALTH MAINTENANCE: Social History   Tobacco Use  . Smoking status: Never Smoker  . Smokeless tobacco: Never Used  Vaping Use  . Vaping Use: Never used  Substance Use Topics  . Alcohol use: No  . Drug use: No      Colonoscopy:Never  PAP:  Bone density:[facility], 02/14/2013, T score -0.6   Allergies  Allergen Reactions  . Penicillins Itching    Current Outpatient Medications  Medication Sig Dispense Refill  . albuterol (ACCUNEB) 0.63 MG/3ML nebulizer solution Take 1 ampule by nebulization every 6 (six) hours as needed for wheezing.    Marland Kitchen amitriptyline (ELAVIL) 25 MG tablet Take 25 mg by mouth daily.  1  . gabapentin (NEURONTIN) 300 MG capsule TAKE 1 CAPSULE BY MOUTH EVERYDAY AT BEDTIME 90 capsule 0  . tamoxifen (NOLVADEX) 20 MG tablet Take 20 mg by mouth daily.    . tamoxifen (NOLVADEX) 20 MG tablet TAKE 1 TABLET BY MOUTH EVERY DAY 90 tablet 12  . tamoxifen (NOLVADEX) 20 MG tablet TAKE 1 TABLET BY MOUTH EVERY DAY 90 tablet 3   No current facility-administered medications for this visit.    OBJECTIVE:  white woman who appears younger than stated age  53:   04/26/20 1337  BP: 136/75  Pulse: 88  Resp: 20  Temp: 98.7 F (37.1 C)  SpO2: 98%   Wt Readings from Last 3 Encounters:  04/26/20 237 lb 9.6 oz (  107.8 kg)  04/26/19 225 lb 11.2 oz (102.4 kg)  08/30/18 219 lb 1.6 oz (99.4 kg)   Body mass index is 39.54 kg/m.    ECOG FS:1 - Symptomatic but completely ambulatory  Ocular: Sclerae unicteric, pupils round and equal Ear-nose-throat: Wearing a mask Lymphatic: No cervical or supraclavicular adenopathy Lungs no rales or rhonchi Heart regular rate and rhythm Abd soft, nontender, positive bowel sounds MSK no focal spinal tenderness, no joint edema Neuro: non-focal, well-oriented, appropriate affect Breasts: The right breast is status post lumpectomy and radiation with no evidence of disease recurrence.  Both axillae are benign.  The left breast is benign.   LAB RESULTS:  CMP     Component Value Date/Time   NA 139 04/26/2020 1306   NA 140 08/05/2017 1553   K 3.8 04/26/2020 1306   K 4.0 08/05/2017 1553   CL 105 04/26/2020 1306   CO2 26 04/26/2020  1306   CO2 21 (L) 08/05/2017 1553   GLUCOSE 116 (H) 04/26/2020 1306   GLUCOSE 76 08/05/2017 1553   BUN 12 04/26/2020 1306   BUN 12.0 08/05/2017 1553   CREATININE 0.75 04/26/2020 1306   CREATININE 0.7 08/05/2017 1553   CALCIUM 8.8 (L) 04/26/2020 1306   CALCIUM 9.0 08/05/2017 1553   PROT 7.4 04/26/2020 1306   PROT 7.7 08/05/2017 1553   ALBUMIN 3.3 (L) 04/26/2020 1306   ALBUMIN 3.5 08/05/2017 1553   AST 18 04/26/2020 1306   AST 20 08/05/2017 1553   ALT 17 04/26/2020 1306   ALT 22 08/05/2017 1553   ALKPHOS 97 04/26/2020 1306   ALKPHOS 69 08/05/2017 1553   BILITOT 0.3 04/26/2020 1306   BILITOT 0.28 08/05/2017 1553   GFRNONAA >60 04/26/2020 1306   GFRAA >60 04/26/2020 1306    No results found for: TOTALPROTELP, ALBUMINELP, A1GS, A2GS, BETS, BETA2SER, GAMS, MSPIKE, SPEI  No results found for: Nils Pyle, [facility]  Lab Results  Component Value Date   WBC 5.6 04/26/2020   NEUTROABS 2.9 04/26/2020   HGB 12.3 04/26/2020   HCT 39.0 04/26/2020   MCV 93.3 04/26/2020   PLT 280 04/26/2020      Chemistry      Component Value Date/Time   NA 139 04/26/2020 1306   NA 140 08/05/2017 1553   K 3.8 04/26/2020 1306   K 4.0 08/05/2017 1553   CL 105 04/26/2020 1306   CO2 26 04/26/2020 1306   CO2 21 (L) 08/05/2017 1553   BUN 12 04/26/2020 1306   BUN 12.0 08/05/2017 1553   CREATININE 0.75 04/26/2020 1306   CREATININE 0.7 08/05/2017 1553      Component Value Date/Time   CALCIUM 8.8 (L) 04/26/2020 1306   CALCIUM 9.0 08/05/2017 1553   ALKPHOS 97 04/26/2020 1306   ALKPHOS 69 08/05/2017 1553   AST 18 04/26/2020 1306   AST 20 08/05/2017 1553   ALT 17 04/26/2020 1306   ALT 22 08/05/2017 1553   BILITOT 0.3 04/26/2020 1306   BILITOT 0.28 08/05/2017 1553       No results found for: LABCA2  No components found for: JOINOM767  No results for input(s): INR in the last 168 hours.  Urinalysis    Component Value Date/Time   COLORURINE STRAW (A) 04/13/2017 0757    APPEARANCEUR CLEAR 04/13/2017 0757   LABSPEC 1.002 (L) 04/13/2017 0757   PHURINE 6.0 04/13/2017 0757   GLUCOSEU NEGATIVE 04/13/2017 0757   HGBUR SMALL (A) 04/13/2017 0757   BILIRUBINUR NEGATIVE 04/13/2017 0757   KETONESUR NEGATIVE 04/13/2017  [place] 04/13/2017 0757   NITRITE NEGATIVE 04/13/2017 0757   LEUKOCYTESUR NEGATIVE 04/13/2017 0757     STUDIES: MM DIAG BREAST TOMO BILATERAL  Result Date: 04/02/2020 CLINICAL DATA:  49 year old female with history malignant right lumpectomy with radiation therapy in 2018. The patient has felt a palpable lump in the left axilla for approximately 12 weeks since her COVID vaccine. The size and tenderness has decreased in the interim. EXAM: DIGITAL DIAGNOSTIC BILATERAL MAMMOGRAM WITH CAD AND TOMO ULTRASOUND LEFT AXILLA COMPARISON:  Previous exam(s). ACR Breast Density Category b: There are scattered areas of fibroglandular density. FINDINGS: No suspicious mammographic findings are identified in either breast. The parenchymal pattern is stable. Post lumpectomy changes are noted in the medial right breast. A radiopaque BB was placed at the site of the palpable lump in the patient's left axilla. A mammographically normal lymph node is noted deep to the radiopaque BB. Mammographic images were processed with CAD. Targeted ultrasound is performed, showing morphologically normal left axillary lymph nodes with less than 3 mm of cortical thickening. No suspicious findings are identified. IMPRESSION: 1. No mammographic evidence of malignancy in either breast. 2. Morphologically normal left axillary lymph node corresponding with the patient's palpable lump. 3. Stable right breast posttreatment changes. RECOMMENDATION: 1. Clinical follow-up recommended for the palpable area of concern in the left axilla. Any further workup should be based on clinical grounds. 2.  Diagnostic mammogram is suggested in 1 year. (Code:DM-B-01Y) I have discussed the findings and  recommendations with the patient. If applicable, a reminder letter will be sent to the patient regarding the next appointment. BI-RADS CATEGORY  2: Benign. Electronically Signed   By: Ruth Nguyen M.D.   On: 04/02/2020 08:57   [place] AXILLA LEFT  Result Date: 04/02/2020 CLINICAL DATA:  49 year old female with history malignant right lumpectomy with radiation therapy in 2018. The patient has felt a palpable lump in the left axilla for approximately 12 weeks since her COVID vaccine. The size and tenderness has decreased in the interim. EXAM: DIGITAL DIAGNOSTIC BILATERAL MAMMOGRAM WITH CAD AND TOMO ULTRASOUND LEFT AXILLA COMPARISON:  Previous exam(s). ACR Breast Density Category b: There are scattered areas of fibroglandular density. FINDINGS: No suspicious mammographic findings are identified in either breast. The parenchymal pattern is stable. Post lumpectomy changes are noted in the medial right breast. A radiopaque BB was placed at the site of the palpable lump in the patient's left axilla. A mammographically normal lymph node is noted deep to the radiopaque BB. Mammographic images were processed with CAD. Targeted ultrasound is performed, showing morphologically normal left axillary lymph nodes with less than 3 mm of cortical thickening. No suspicious findings are identified. IMPRESSION: 1. No mammographic evidence of malignancy in either breast. 2. Morphologically normal left axillary lymph node corresponding with the patient's palpable lump. 3. Stable right breast posttreatment changes. RECOMMENDATION: 1. Clinical follow-up recommended for the palpable area of concern in the left axilla. Any further workup should be based on clinical grounds. 2.  Diagnostic mammogram is suggested in 1 year. (Code:DM-B-01Y) I have discussed the findings and recommendations with the patient. If applicable, a reminder letter will be sent to the patient regarding the next appointment. BI-RADS CATEGORY  2: Benign. Electronically  Signed   By: Ruth Nguyen M.D.   On: 04/02/2020 08:57     ELIGIBLE FOR AVAILABLE RESEARCH PROTOCOL: No  ASSESSMENT: 49 y.o.  woman status post right breast lower outer quadrant biopsy 03/23/2017 for a clinical T1b N0, stage IA  invasive ductal carcinoma, grade 1, estrogen and progesterone receptor positive, HER-2 nonamplified, with an MIB-1 of 5%  (1) genetics testing 04/01/2017 through the"Common Hereditary Cancer Panel",performed at [facility], found no deleterious mutations in  PC, ATM, AXIN2, BARD1, BMPR1A, BRCA1, BRCA2, BRIP1, CDH1, CDKN2A (p14ARF), CDKN2A (p16INK4a), CHEK2, CTNNA1, DICER1, EPCAM (Deletion/duplication testing only), GREM1 (promoter region deletion/duplication testing only), KIT, MEN1, MLH1, MSH2, MSH3, MSH6, MUTYH, NBN, NF1, NHTL1, PALB2, PDGFRA, PMS2, POLD1, POLE, PTEN, RAD50, RAD51C, RAD51D, SDHB, SDHC, SDHD, SMAD4, SMARCA4. STK11, TP53, TSC1, TSC2, and VHL.    (2) Oncotype DX score of 14 predicts a 10 year outside the breast risk of recurrence of 9% if the patient's only systemic therapy is tamoxifen for 5 years. It also predicts no benefit from adjuvant chemotherapy.  (3) right lumpectomy and sentinel lymph node sampling 04/21/2017 showed a pT1b pN0, stage IA invasive ductal carcinoma, grade 1, with a focally present anterior margin for DCIS  (a) margins cleared with additional surgery 04/29/2017  (4) Adjuvant radiation 06/09/2017 to 07/27/2017 Site/dose:    1. The Right breast was treated to 50.4 Gy in 28 fractions of 1.8 Gy per fraction. 2. The Right breast was boosted to 10 Gy in 5 fractions of 2 Gy.   (5) tamoxifen started neoadjuvantly 04/01/2017 continued through surgery and radiation  (a) bone density at GYN's office normal June 2019 PLAN: Monda is now 3 years out from definitive surgery for her breast cancer with no evidence of disease recurrence.  This is very favorable.  She is tolerating tamoxifen well the plan is to  continue that a minimum of 5 years.  She is having significant sleep issues.  This is likely worse because both her children are now home and they are both "night owls".  We discussed "sleep hygiene" issues.  She is also already on amitriptyline and gabapentin at bedtime.  She knows to call for any other issue that may develop before the next visit  Total encounter time 25 minutes.*  Oluwanifemi Petitti, Virgie Dad, MD  04/26/20 2:05 PM Medical Oncology and Hematology [facility] [address] [place], [place] [address] Tel. [phone]    Fax. [phone]    I, Jacqualyn Posey am acting as a Education administrator for Chauncey Cruel, MD.   I, Lurline Del MD, have reviewed the above documentation for accuracy and completeness, and I agree with the above.    *Total Encounter Time as defined by the Centers for Medicare and Medicaid Services includes, in addition to the face-to-face time of a patient visit (documented in the note above) non-face-to-face time: obtaining and reviewing outside history, ordering and reviewing medications, tests or procedures, care coordination (communications with other health care professionals or caregivers) and documentation in the medical record.

## 2020-04-26 NOTE — Telephone Encounter (Signed)
Scheduled appts per 7/15 los. Gave pt a print out of AVS.

## 2021-02-22 ENCOUNTER — Other Ambulatory Visit: Payer: Self-pay | Admitting: Obstetrics and Gynecology

## 2021-02-22 DIAGNOSIS — Z1231 Encounter for screening mammogram for malignant neoplasm of breast: Secondary | ICD-10-CM

## 2021-03-29 ENCOUNTER — Other Ambulatory Visit: Payer: Self-pay | Admitting: Obstetrics and Gynecology

## 2021-03-29 DIAGNOSIS — N63 Unspecified lump in unspecified breast: Secondary | ICD-10-CM

## 2021-04-18 ENCOUNTER — Ambulatory Visit: Payer: BC Managed Care – PPO

## 2021-04-24 ENCOUNTER — Other Ambulatory Visit: Payer: Self-pay

## 2021-04-24 DIAGNOSIS — C50311 Malignant neoplasm of lower-inner quadrant of right female breast: Secondary | ICD-10-CM

## 2021-04-24 DIAGNOSIS — Z170 Estrogen receptor positive status [ER+]: Secondary | ICD-10-CM

## 2021-04-24 NOTE — Progress Notes (Signed)
[place]  [phone] [phone]     ID: Glennette Galster DOB: [birth year]  MR#: [id number]  TMH#:[id number]  Patient Care Team: Scifres, Durel Salts as PCP - General (Physician Assistant) Everlene Farrier, MD as Consulting Physician (Obstetrics and Gynecology) Excell Seltzer, MD (Inactive) as Consulting Physician (General Surgery) Diaz Crago, Virgie Dad, MD as Consulting Physician (Oncology) Gery Pray, MD as Consulting Physician (Radiation Oncology) OTHER MD:  CHIEF COMPLAINT: Estrogen receptor positive breast cancer  CURRENT TREATMENT: Tamoxifen   INTERVAL HISTORY: Chelse was scheduled today for follow-up of her estrogen receptor positive breast cancer.  However she did not show    She is scheduled for bilateral diagnostic mammography and right breast ultrasonography on 05/10/2021.   REVIEW OF SYSTEMS: Anderson Malta    COVID 19 VACCINATION STATUS:    BREAST CANCER HISTORY: From the original intake note:  Terri's college roommate recently died from stage IV breast cancer. This was a major event in Shawniece's life and it motivated her to start doing breast self exams. She also noted pain in her left breast and she brought this to medical attention. Dr. Gertie Fey set her up for bilateral diagnostic mammography with tomography and bilateral breast ultrasonography at the [facility] 03/19/2017. This showed the breast density to be category B. In the lower inner quadrant of the right breast there was an area of asymmetry which on ultrasound corresponded to an irregular hypoechoic mass at the 4:00 radiant 7 cm from the nipple measuring 0.9 cm. The right axilla was negative by ultrasonography  The left breast by contrast was negative both by mammography and ultrasonography.   Biopsy of the right breast mass in question 03/23/2017 showed ([id number]) and invasive ductal carcinoma, grade 1, estrogen receptor 95% positive, progesterone receptor  100% positive, both with strong staining intensity, with an MIB-1 of 5%, and no HER-2 amplification, the signals ratio being 1.42 and the number per cell 1.85.  The patient's subsequent history is as detailed below.   PAST MEDICAL HISTORY: Past Medical History:  Diagnosis Date   Anemia    Anxiety    Breast cancer ([place])    Complication of anesthesia    Family history of breast cancer 04/01/2017   History of radiation therapy 06/09/2017-07/27/2017   right breast was treated to 50.4 Gy in 28 fractions, rightr breast was boosted to 10 Gy in 5 fractions   Migraines    Personal history of radiation therapy    PONV (postoperative nausea and vomiting)    pt needs scope patch    PAST SURGICAL HISTORY: Past Surgical History:  Procedure Laterality Date   BREAST BIOPSY Right 03/2017   BREAST LUMPECTOMY Right 05/2017   BREAST LUMPECTOMY WITH RADIOACTIVE SEED AND SENTINEL LYMPH NODE BIOPSY Right 04/21/2017   Procedure: BREAST LUMPECTOMY WITH RADIOACTIVE SEED AND SENTINEL LYMPH NODE BIOPSY;  Surgeon: Excell Seltzer, MD;  Location: [facility];  Service: General;  Laterality: Right;   CLOSED REDUCTION ELBOW DISLOCATION     DILATION AND CURETTAGE OF UTERUS     miscarriage     RE-EXCISION OF BREAST LUMPECTOMY Right 04/29/2017   Procedure: RIGHT RE-EXCISION OF BREAST LUMPECTOMY;  Surgeon: Excell Seltzer, MD;  Location: [facility];  Service: General;  Laterality: Right;  ERAS PATHWAY   WISDOM TOOTH EXTRACTION      FAMILY HISTORY Family History  Problem Relation Age of Onset   Fibromyalgia Mother    Heart failure Mother    Mental illness Sister    ADD /  ADHD Daughter    Asthma Son    ADD / ADHD Son    Osteoporosis Maternal Grandmother    Parkinson's disease Maternal Grandfather    Breast cancer Paternal Grandmother 108       died in 79's   Stroke Paternal Grandfather    Cancer Paternal Grandfather 63       abdominal cancer- stomach?Colon?  The patient's  parents are both 58 years old as of June 2018. The patient has one brother, one sister. A paternal grandmother was diagnosed with breast cancer in her 44s. A paternal grandfather had "stomach" cancer at an older adult. There is no history of ovarian cancer in the family.   GYNECOLOGIC HISTORY:  No LMP recorded. (Menstrual status: Irregular Periods). Menarche age 7, first live birth age 7, she is [place] P2. She still has regular periods which last 5-7 days of which the first 2 days are heavy. She took oral contraceptives briefly for about a year remotely, stopped because of headaches.   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 She is divorced, at home is just she and her 2 children, [name], studying English at [employer], an eighth grader, as of June 2018. 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    ADVANCED DIRECTIVES: At the 04/01/2017 visit the patient was given the appropriate documents to complete and notarize at her discretion   HEALTH MAINTENANCE: Social History   Tobacco Use   Smoking status: Never   Smokeless tobacco: Never  Vaping Use   Vaping Use: Never used  Substance Use Topics   Alcohol use: No   Drug use: No     Colonoscopy:Never  PAP:  Bone density:[facility], 02/14/2013, T score -0.6   Allergies  Allergen Reactions   Penicillins Itching    Current Outpatient Medications  Medication Sig Dispense Refill   albuterol (ACCUNEB) 0.63 MG/3ML nebulizer solution Take 1 ampule by nebulization every 6 (six) hours as needed for wheezing.     amitriptyline (ELAVIL) 25 MG tablet Take 25 mg by mouth daily.  1   gabapentin (NEURONTIN) 300 MG capsule TAKE 1 CAPSULE BY MOUTH EVERYDAY AT BEDTIME 90 capsule 4   tamoxifen (NOLVADEX) 20 MG tablet Take 1 tablet (20 mg total) by mouth daily. 90 tablet 4   No current facility-administered medications for this  visit.    OBJECTIVE:    There were no vitals filed for this visit.  Wt Readings from Last 3 Encounters:  04/26/20 237 lb 9.6 oz (107.8 kg)  04/26/19 225 lb 11.2 oz (102.4 kg)  08/30/18 219 lb 1.6 oz (99.4 kg)   There is no height or weight on file to calculate BMI.    ECOG FS:1 - Symptomatic but completely ambulatory    LAB RESULTS:  CMP     Component Value Date/Time   NA 139 04/26/2020 1306   NA 140 08/05/2017 1553   K 3.8 04/26/2020 1306   K 4.0 08/05/2017 1553   CL 105 04/26/2020 1306   CO2 26 04/26/2020 1306   CO2 21 (L) 08/05/2017 1553   GLUCOSE 116 (H) 04/26/2020 1306   GLUCOSE 76 08/05/2017 1553   BUN 12 04/26/2020 1306   BUN 12.0 08/05/2017 1553   CREATININE 0.75 04/26/2020 1306   CREATININE 0.7 08/05/2017 1553   CALCIUM 8.8 (L) 04/26/2020 1306   CALCIUM 9.0 08/05/2017 1553   PROT 7.4 04/26/2020 1306  PROT 7.7 08/05/2017 1553   ALBUMIN 3.3 (L) 04/26/2020 1306   ALBUMIN 3.5 08/05/2017 1553   AST 18 04/26/2020 1306   AST 20 08/05/2017 1553   ALT 17 04/26/2020 1306   ALT 22 08/05/2017 1553   ALKPHOS 97 04/26/2020 1306   ALKPHOS 69 08/05/2017 1553   BILITOT 0.3 04/26/2020 1306   BILITOT 0.28 08/05/2017 1553   GFRNONAA >60 04/26/2020 1306   GFRAA >60 04/26/2020 1306    No results found for: TOTALPROTELP, ALBUMINELP, A1GS, A2GS, BETS, BETA2SER, GAMS, MSPIKE, SPEI  No results found for: Nils Pyle, [facility]  Lab Results  Component Value Date   WBC 5.6 04/26/2020   NEUTROABS 2.9 04/26/2020   HGB 12.3 04/26/2020   HCT 39.0 04/26/2020   MCV 93.3 04/26/2020   PLT 280 04/26/2020      Chemistry      Component Value Date/Time   NA 139 04/26/2020 1306   NA 140 08/05/2017 1553   K 3.8 04/26/2020 1306   K 4.0 08/05/2017 1553   CL 105 04/26/2020 1306   CO2 26 04/26/2020 1306   CO2 21 (L) 08/05/2017 1553   BUN 12 04/26/2020 1306   BUN 12.0 08/05/2017 1553   CREATININE 0.75 04/26/2020 1306   CREATININE 0.7 08/05/2017 1553       Component Value Date/Time   CALCIUM 8.8 (L) 04/26/2020 1306   CALCIUM 9.0 08/05/2017 1553   ALKPHOS 97 04/26/2020 1306   ALKPHOS 69 08/05/2017 1553   AST 18 04/26/2020 1306   AST 20 08/05/2017 1553   ALT 17 04/26/2020 1306   ALT 22 08/05/2017 1553   BILITOT 0.3 04/26/2020 1306   BILITOT 0.28 08/05/2017 1553       No results found for: LABCA2  No components found for: [id number]  No results for input(s): INR in the last 168 hours.  Urinalysis    Component Value Date/Time   COLORURINE STRAW (A) 04/13/2017 0757   APPEARANCEUR CLEAR 04/13/2017 0757   LABSPEC 1.002 (L) 04/13/2017 0757   PHURINE 6.0 04/13/2017 0757   GLUCOSEU NEGATIVE 04/13/2017 0757   HGBUR SMALL (A) 04/13/2017 0757   BILIRUBINUR NEGATIVE 04/13/2017 0757   KETONESUR NEGATIVE 04/13/2017 0757   PROTEINUR NEGATIVE 04/13/2017 0757   NITRITE NEGATIVE 04/13/2017 0757   LEUKOCYTESUR NEGATIVE 04/13/2017 0757    STUDIES: No results found.   ELIGIBLE FOR AVAILABLE RESEARCH PROTOCOL: No  ASSESSMENT: 50 y.o. [place] woman status post right breast lower outer quadrant biopsy 03/23/2017 for a clinical T1b N0, stage IA invasive ductal carcinoma, grade 1, estrogen and progesterone receptor positive, HER-2 nonamplified, with an MIB-1 of 5%  (1) genetics testing 04/01/2017 through the"Common Hereditary Cancer Panel",performed at [facility], found no deleterious mutations in  PC, ATM, AXIN2, BARD1, BMPR1A, BRCA1, BRCA2, BRIP1, CDH1, CDKN2A (p14ARF), CDKN2A (p16INK4a), CHEK2, CTNNA1, DICER1, EPCAM (Deletion/duplication testing only), GREM1 (promoter region deletion/duplication testing only), KIT, MEN1, MLH1, MSH2, MSH3, MSH6, MUTYH, NBN, NF1, NHTL1, PALB2, PDGFRA, PMS2, POLD1, POLE, PTEN, RAD50, RAD51C, RAD51D, SDHB, SDHC, SDHD, SMAD4, SMARCA4. STK11, TP53, TSC1, TSC2, and VHL.    (2) Oncotype DX score of 14 predicts a 10 year outside the breast risk of recurrence of 9% if the patient's only systemic  therapy is tamoxifen for 5 years. It also predicts no benefit from adjuvant chemotherapy.  (3) right lumpectomy and sentinel lymph node sampling 04/21/2017 showed a pT1b pN0, stage IA invasive ductal carcinoma, grade 1, with a focally present anterior margin for DCIS  (a) margins cleared with additional  surgery 04/29/2017  (4) Adjuvant radiation 06/09/2017 to 07/27/2017  Site/dose:    1. The Right breast was treated to 50.4 Gy in 28 fractions of 1.8 Gy per fraction. 2. The Right breast was boosted to 10 Gy in 5 fractions of 2 Gy.    (5) tamoxifen started neoadjuvantly 04/01/2017 continued through surgery and radiation  (a) bone density at [facility] office normal June 2019   PLAN: Amauri did not show for her 04/25/2021 visit.  A follow-up letter has been sent   Taralyn Ferraiolo, Virgie Dad, MD  04/25/21 12:02 PM Medical Oncology and Hematology [facility] [place], [place] [address] Tel. [phone]    Fax. [phone]    I, Wilburn Mylar, am acting as scribe for Dr. Virgie Dad. Dwayne Bulkley.  I, Lurline Del MD, have reviewed the above documentation for accuracy and completeness, and I agree with the above.    *Total Encounter Time as defined by the Centers for Medicare and Medicaid Services includes, in addition to the face-to-face time of a patient visit (documented in the note above) non-face-to-face time: obtaining and reviewing outside history, ordering and reviewing medications, tests or procedures, care coordination (communications with other health care professionals or caregivers) and documentation in the medical record.

## 2021-04-25 ENCOUNTER — Inpatient Hospital Stay: Payer: BC Managed Care – PPO | Attending: Oncology | Admitting: Oncology

## 2021-04-25 ENCOUNTER — Encounter: Payer: Self-pay | Admitting: Oncology

## 2021-04-25 ENCOUNTER — Inpatient Hospital Stay: Payer: BC Managed Care – PPO

## 2021-04-25 DIAGNOSIS — C50311 Malignant neoplasm of lower-inner quadrant of right female breast: Secondary | ICD-10-CM

## 2021-04-25 DIAGNOSIS — Z170 Estrogen receptor positive status [ER+]: Secondary | ICD-10-CM

## 2021-05-10 ENCOUNTER — Ambulatory Visit
Admission: RE | Admit: 2021-05-10 | Discharge: 2021-05-10 | Disposition: A | Discharge status: Home / Self Care | Payer: BC Managed Care – PPO | Source: Ambulatory Visit | Attending: Obstetrics and Gynecology | Admitting: Obstetrics and Gynecology

## 2021-05-10 ENCOUNTER — Other Ambulatory Visit: Payer: Self-pay

## 2021-05-10 DIAGNOSIS — N63 Unspecified lump in unspecified breast: Secondary | ICD-10-CM

## 2021-05-13 ENCOUNTER — Other Ambulatory Visit: Payer: Self-pay | Admitting: Oncology

## 2021-08-08 IMAGING — MG DIGITAL DIAGNOSTIC BILAT W/ TOMO W/ CAD
6 of 12 series · 6 of 36 positions shown · non-contrast
Comparison: Previous exam(s).

CLINICAL DATA: History of RIGHT breast cancer status post
lumpectomy and radiation therapy in 1868. Patient describes a
palpable lump/firmness at the lumpectomy site.

EXAM:
DIGITAL DIAGNOSTIC BILATERAL MAMMOGRAM WITH TOMOSYNTHESIS AND CAD;
ULTRASOUND RIGHT BREAST LIMITED
TECHNIQUE: Bilateral digital diagnostic mammography and breast tomosynthesis
was performed. The images were evaluated with computer-aided
detection.; Targeted ultrasound examination of the right breast was
performed

[R MLO synth-2D (1 of 2)]
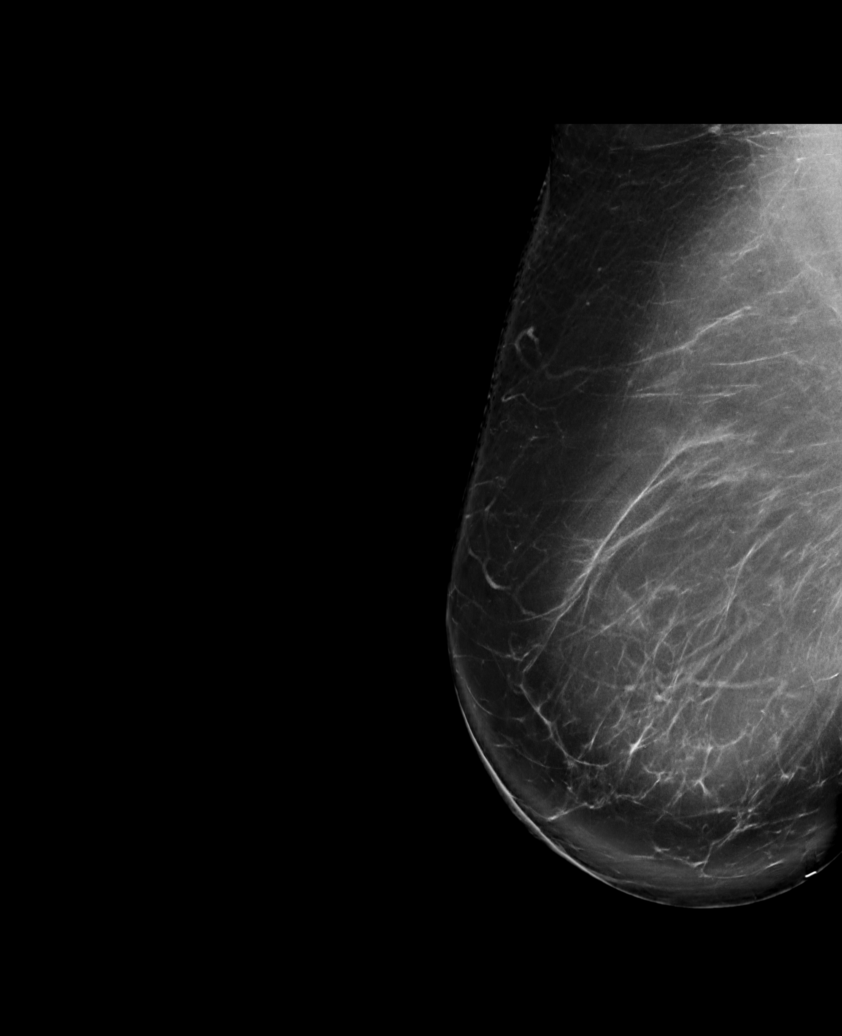

[R CC synth-2D]
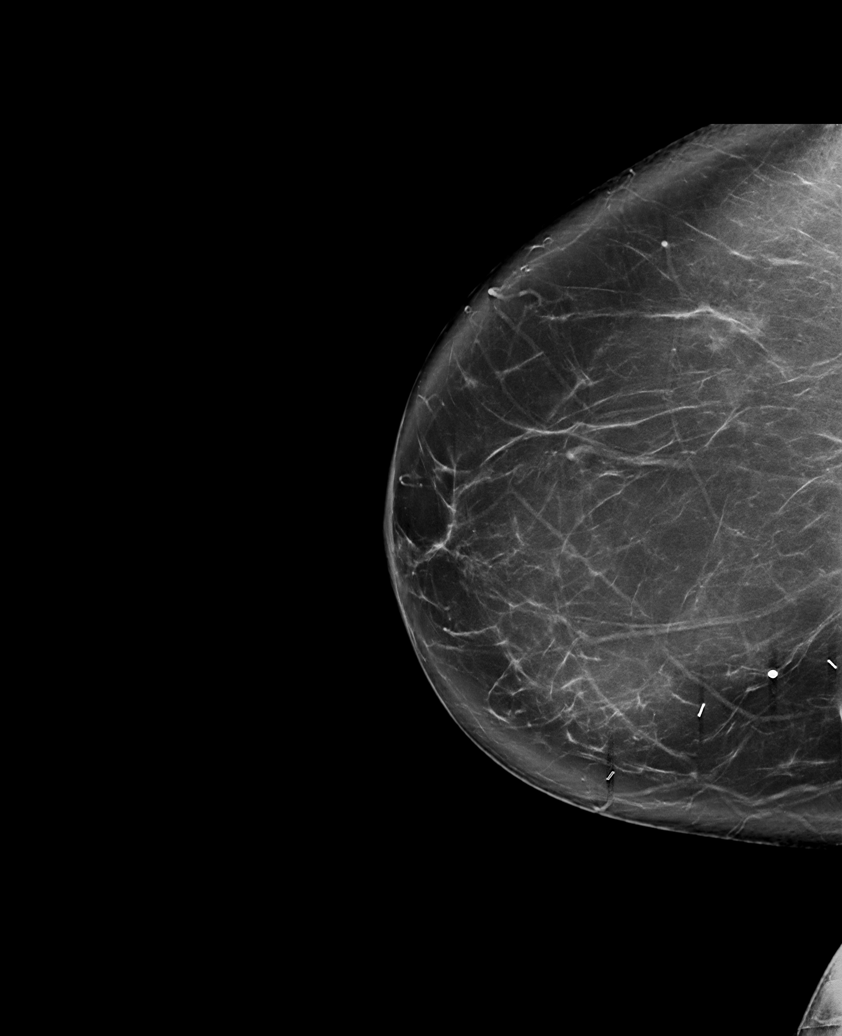

[R MLO synth-2D (2 of 2)]
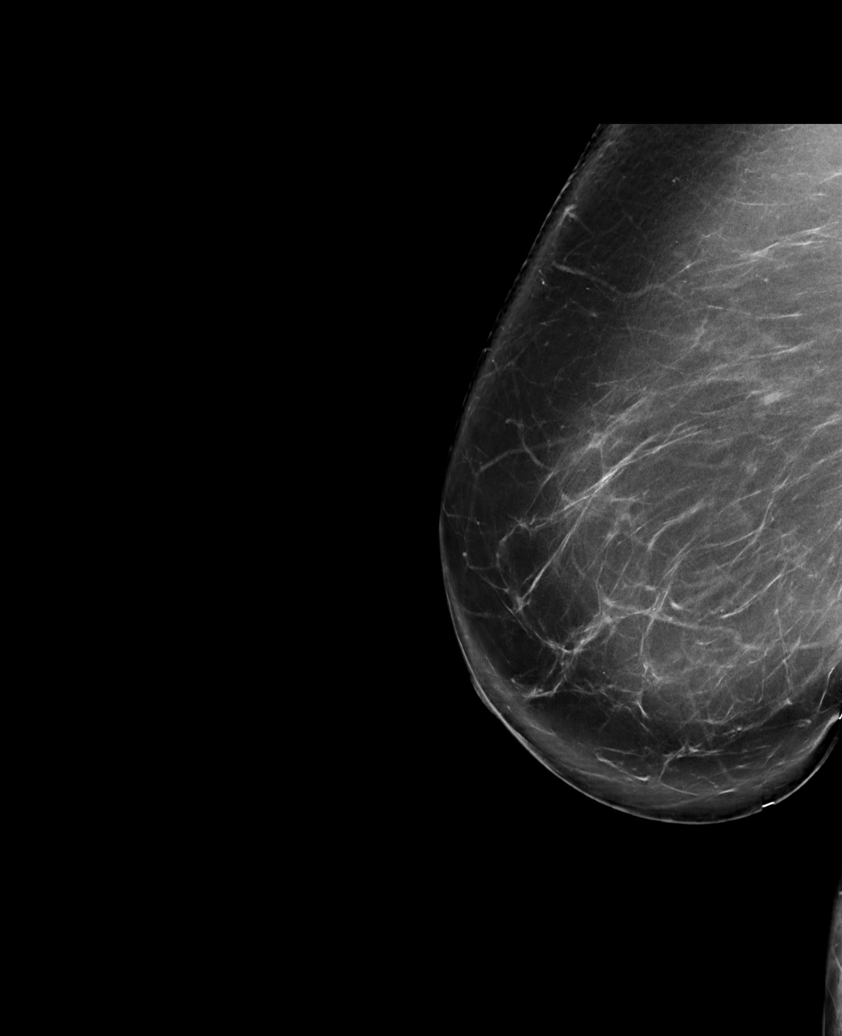

[L MLO synth-2D]
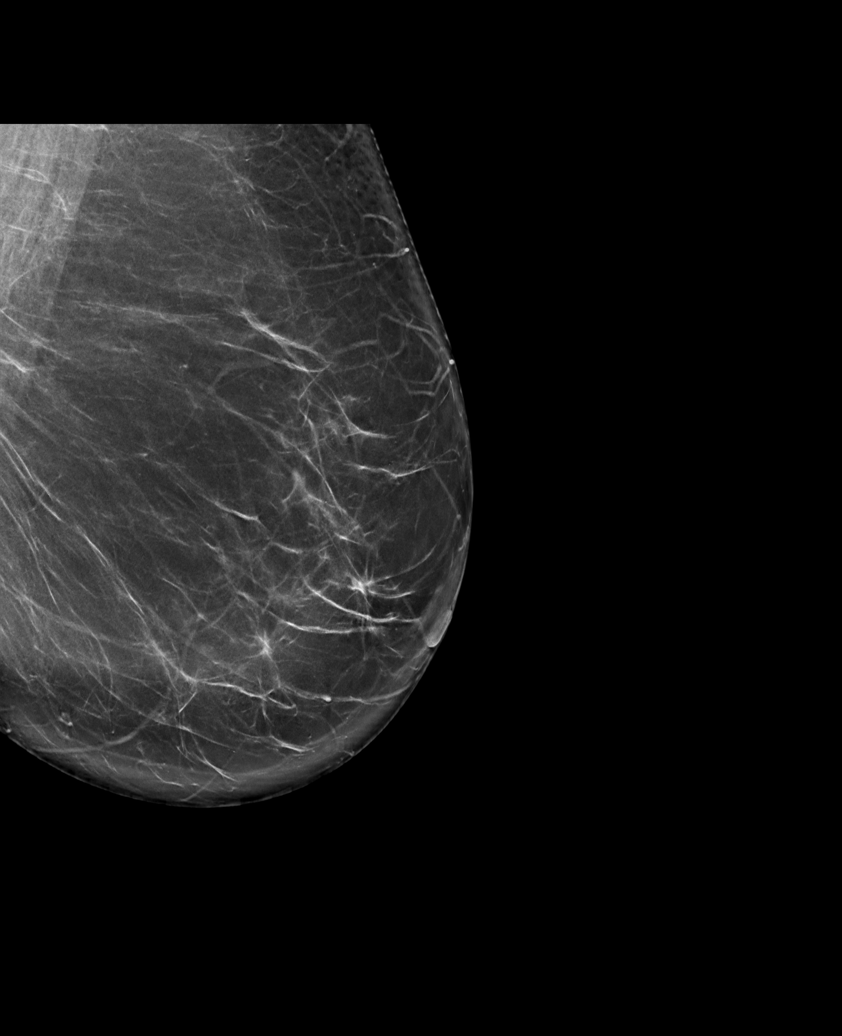

[R ML synth-2D]
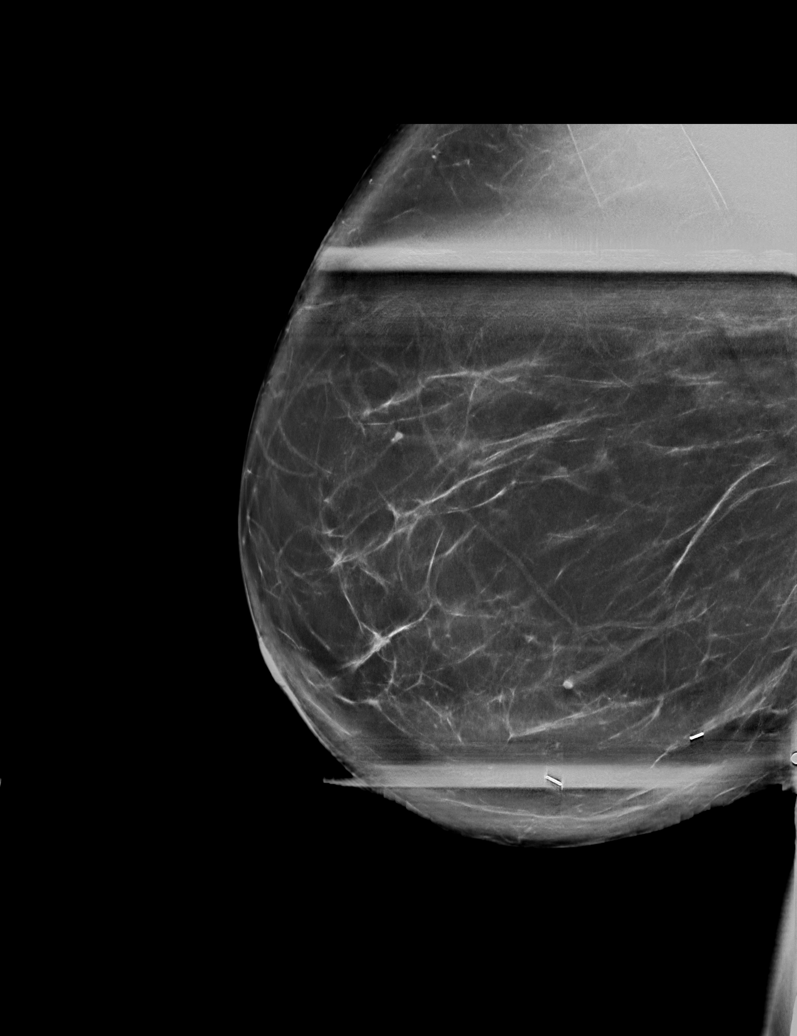

[L CC synth-2D]
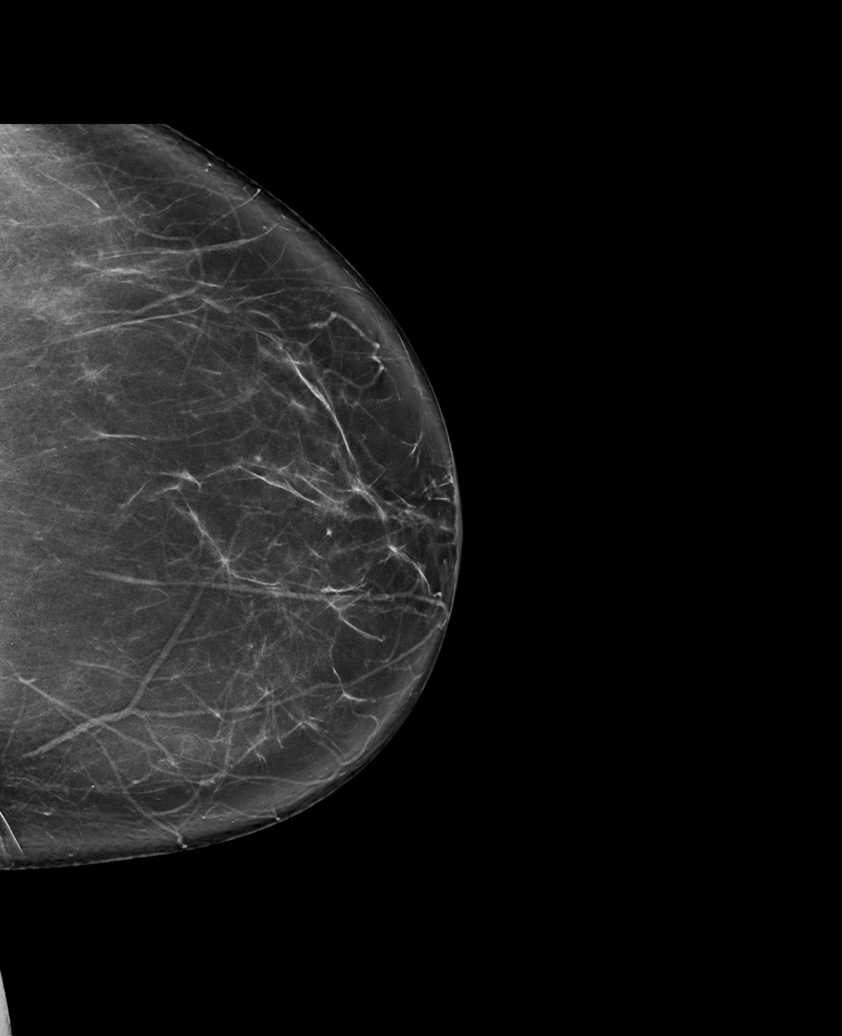

[6 of 36 positions shown; findings below may reference images not displayed]

ACR Breast Density Category b: There are scattered areas of
fibroglandular density.
FINDINGS: Bilateral diagnostic mammogram:

There are stable postsurgical changes within the RIGHT breast. There
are no new dominant masses, suspicious calcifications or secondary
signs of malignancy within the RIGHT breast. Specifically, there is
no mammographic abnormality within the lower inner RIGHT breast
corresponding to the palpable area of concern.

There are no new dominant masses, suspicious calcifications or
secondary signs of malignancy within the LEFT breast

Targeted ultrasound is performed, evaluating the lower inner
quadrant of the RIGHT breast, inframammary fold region, as directed
by the patient, showing only normal fibroglandular tissues and fat
lobules. No solid or cystic mass.
IMPRESSION: No evidence of malignancy within either breast. Stable postsurgical
changes within the lower inner quadrant of the RIGHT breast.

RECOMMENDATION:
1.  Screening mammogram in one year.(Code:[id number])
2. The patient was instructed to return sooner if the area that she
feels becomes larger and/or firmer to palpation, or if a new
palpable abnormality is identified in either breast.

I have discussed the findings and recommendations with the patient.
If applicable, a reminder letter will be sent to the patient
regarding the next appointment.

BI-RADS CATEGORY  2: Benign.

## 2021-08-11 NOTE — Progress Notes (Signed)
[place]  [phone] [phone]     ID: Ruth Nguyen DOB: 05/15/1971  MR#: [id number]  RFF#:[id number]  Patient Care Team: Scifres, Durel Salts as PCP - General (Physician Assistant) Everlene Farrier, MD as Consulting Physician (Obstetrics and Gynecology) Excell Seltzer, MD (Inactive) as Consulting Physician (General Surgery) Takuya Lariccia, Virgie Dad, MD as Consulting Physician (Oncology) Gery Pray, MD as Consulting Physician (Radiation Oncology) OTHER MD:  CHIEF COMPLAINT: Estrogen receptor positive breast cancer  CURRENT TREATMENT: Completing 5 years tamoxifen   INTERVAL HISTORY: Ayesha returns today for follow-up of her estrogen receptor positive breast cancer.  She continues on tamoxifen.  She has hot flashes both daytime and nighttime.  Her sleep now is better however.  In general she is tolerating the medication quite well.  Since her last visit, she presented for annual mammography with a palpable lump/firmness at the lumpectomy site. She underwent bilateral diagnostic mammography with tomography and right breast ultrasonography at [facility] on 05/10/2021 showing: breast density category B; no evidence of malignancy in either breast.    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  She is on her feet a lot.  She also does some yard work and tries to walk for exercise.  A detailed review of systems was otherwise stable   COVID 19 VACCINATION STATUS: Status post Pfizer x3 as of October 2022   BREAST CANCER HISTORY: From the original intake note:  Ethyl's college roommate recently died from stage IV breast cancer. This was a major event in Lateya's life and it motivated her to start doing breast self exams. She also noted pain in her left breast and she brought this to medical  attention. Dr. Gertie Fey set her up for bilateral diagnostic mammography with tomography and bilateral breast ultrasonography at [facility] 03/19/2017. This showed the breast density to be category B. In the lower inner quadrant of the right breast there was an area of asymmetry which on ultrasound corresponded to an irregular hypoechoic mass at the 4:00 radiant 7 cm from the nipple measuring 0.9 cm. The right axilla was negative by ultrasonography  The left breast by contrast was negative both by mammography and ultrasonography.   Biopsy of the right breast mass in question 03/23/2017 showed ([id number]) and invasive ductal carcinoma, grade 1, estrogen receptor 95% positive, progesterone receptor 100% positive, both with strong staining intensity, with an MIB-1 of 5%, and no HER-2 amplification, the signals ratio being 1.42 and the number per cell 1.85.  The patient's subsequent history is as detailed below.   PAST MEDICAL HISTORY: Past Medical History:  Diagnosis Date   Anemia    Anxiety    Breast cancer ([place])    Complication of anesthesia    Family history of breast cancer 04/01/2017   History of radiation therapy 06/09/2017-07/27/2017   right breast was treated to 50.4 Gy in 28 fractions, rightr breast was boosted to 10 Gy in 5 fractions   Migraines    Personal history of radiation therapy    PONV (postoperative nausea and vomiting)    pt needs scope patch    PAST SURGICAL HISTORY: Past Surgical History:  Procedure Laterality Date   BREAST BIOPSY Right 03/2017   BREAST LUMPECTOMY Right 05/2017   BREAST LUMPECTOMY WITH RADIOACTIVE SEED AND SENTINEL LYMPH NODE BIOPSY Right 04/21/2017   Procedure:  BREAST LUMPECTOMY WITH RADIOACTIVE SEED AND SENTINEL LYMPH NODE BIOPSY;  Surgeon: Excell Seltzer, MD;  Location: [facility];  Service: General;  Laterality: Right;   CLOSED REDUCTION ELBOW DISLOCATION     DILATION AND CURETTAGE OF UTERUS     miscarriage      RE-EXCISION OF BREAST LUMPECTOMY Right 04/29/2017   Procedure: RIGHT RE-EXCISION OF BREAST LUMPECTOMY;  Surgeon: Excell Seltzer, MD;  Location: [facility];  Service: General;  Laterality: Right;  ERAS PATHWAY   WISDOM TOOTH EXTRACTION      FAMILY HISTORY Family History  Problem Relation Age of Onset   Fibromyalgia Mother    Heart failure Mother    Mental illness Sister    ADD / ADHD Daughter    Asthma Son    ADD / ADHD Son    Osteoporosis Maternal Grandmother    Parkinson's disease Maternal Grandfather    Breast cancer Paternal Grandmother 52       died in 50's   Stroke Paternal Grandfather    Cancer Paternal Grandfather 66       abdominal cancer- stomach?Colon?  The patient's parents are both 46 years old as of June 2018. The patient has one brother, one sister. A paternal grandmother was diagnosed with breast cancer in her 42s. A paternal grandfather had "stomach" cancer at an older adult. There is no history of ovarian cancer in the family.   GYNECOLOGIC HISTORY:  No LMP recorded. (Menstrual status: Irregular Periods). Menarche age 22, first live birth age 43, she is [place] P2. She still has regular periods which last 5-7 days of which the first 2 days are heavy. She took oral contraceptives briefly for about a year remotely, stopped because of headaches.   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 She is divorced, at home is just she and her 2 children, [name], studying English at [employer], an eighth grader, as of June 2018. 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    ADVANCED DIRECTIVES: At the 04/01/2017 visit the patient was given the appropriate documents to complete and notarize at her discretion   HEALTH MAINTENANCE: Social History   Tobacco Use   Smoking status: Never   Smokeless tobacco: Never  Vaping Use   Vaping Use: Never used  Substance  Use Topics   Alcohol use: No   Drug use: No     Colonoscopy:Never  PAP:  Bone density:[facility], 02/14/2013, T score -0.6   Allergies  Allergen Reactions   Penicillins Itching    Current Outpatient Medications  Medication Sig Dispense Refill   albuterol (ACCUNEB) 0.63 MG/3ML nebulizer solution Take 1 ampule by nebulization every 6 (six) hours as needed for wheezing.     amitriptyline (ELAVIL) 25 MG tablet Take 25 mg by mouth daily.  1   gabapentin (NEURONTIN) 300 MG capsule TAKE 1 CAPSULE BY MOUTH EVERYDAY AT BEDTIME 90 capsule 4   tamoxifen (NOLVADEX) 20 MG tablet TAKE 1 TABLET BY MOUTH EVERY DAY 90 tablet 4   No current facility-administered medications for this visit.    OBJECTIVE: White woman who appears stated age  Vitals:   08/12/21 0920  BP: 115/67  Pulse: 89  Resp: 18  Temp: 97.9 F (36.6 C)  SpO2: 98%    Wt Readings from Last 3 Encounters:  08/12/21 241 lb 1.6 oz (109.4 kg)  04/26/20 237 lb 9.6 oz (107.8 kg)  04/26/19 225 lb 11.2 oz (102.4 kg)   Body mass index is 40.12 kg/m.    ECOG FS:1 - Symptomatic but completely ambulatory  Sclerae unicteric, EOMs intact Wearing a mask No cervical or supraclavicular adenopathy Lungs no rales or rhonchi Heart regular rate and rhythm Abd soft, nontender, positive bowel sounds MSK no focal spinal tenderness, no upper extremity lymphedema Neuro: nonfocal, well oriented, appropriate affect Breasts: The right breast is status postlumpectomy and radiation.  There is no evidence of local recurrence.  The left breast and both axillae are benign.   LAB RESULTS:  CMP     Component Value Date/Time   NA 139 08/12/2021 0901   NA 140 08/05/2017 1553   K 4.0 08/12/2021 0901   K 4.0 08/05/2017 1553   CL 107 08/12/2021 0901   CO2 24 08/12/2021 0901   CO2 21 (L) 08/05/2017 1553   GLUCOSE 90 08/12/2021 0901   GLUCOSE 76 08/05/2017 1553   BUN 12 08/12/2021 0901   BUN 12.0 08/05/2017 1553    CREATININE 0.74 08/12/2021 0901   CREATININE 0.7 08/05/2017 1553   CALCIUM 8.6 (L) 08/12/2021 0901   CALCIUM 9.0 08/05/2017 1553   PROT 7.3 08/12/2021 0901   PROT 7.7 08/05/2017 1553   ALBUMIN 3.4 (L) 08/12/2021 0901   ALBUMIN 3.5 08/05/2017 1553   AST 20 08/12/2021 0901   AST 20 08/05/2017 1553   ALT 19 08/12/2021 0901   ALT 22 08/05/2017 1553   ALKPHOS 100 08/12/2021 0901   ALKPHOS 69 08/05/2017 1553   BILITOT 0.3 08/12/2021 0901   BILITOT 0.28 08/05/2017 1553   GFRNONAA >60 08/12/2021 0901   GFRAA >60 04/26/2020 1306    No results found for: Ronnald Ramp, A1GS, A2GS, BETS, BETA2SER, GAMS, MSPIKE, SPEI  No results found for: Nils Pyle, [facility]  Lab Results  Component Value Date   WBC 5.6 08/12/2021   NEUTROABS 3.0 08/12/2021   HGB 12.2 08/12/2021   HCT 38.2 08/12/2021   MCV 91.0 08/12/2021   PLT 305 08/12/2021      Chemistry      Component Value Date/Time   NA 139 08/12/2021 0901   NA 140 08/05/2017 1553   K 4.0 08/12/2021 0901   K 4.0 08/05/2017 1553   CL 107 08/12/2021 0901   CO2 24 08/12/2021 0901   CO2 21 (L) 08/05/2017 1553   BUN 12 08/12/2021 0901   BUN 12.0 08/05/2017 1553   CREATININE 0.74 08/12/2021 0901   CREATININE 0.7 08/05/2017 1553      Component Value Date/Time   CALCIUM 8.6 (L) 08/12/2021 0901   CALCIUM 9.0 08/05/2017 1553   ALKPHOS 100 08/12/2021 0901   ALKPHOS 69 08/05/2017 1553   AST 20 08/12/2021 0901   AST 20 08/05/2017 1553   ALT 19 08/12/2021 0901   ALT 22 08/05/2017 1553   BILITOT 0.3 08/12/2021 0901   BILITOT 0.28 08/05/2017 1553       No results found for: LABCA2  No components found for: [id number]  No results for input(s): INR in the last 168 hours.  Urinalysis    Component Value Date/Time   COLORURINE STRAW (A) 04/13/2017 0757   APPEARANCEUR CLEAR 04/13/2017 0757   LABSPEC 1.002 (L) 04/13/2017 0757   PHURINE 6.0 04/13/2017 0757   GLUCOSEU NEGATIVE 04/13/2017 0757   HGBUR SMALL  (A) 04/13/2017 0757   BILIRUBINUR NEGATIVE 04/13/2017 [place] 04/13/2017 0757   PROTEINUR NEGATIVE 04/13/2017 0757   NITRITE NEGATIVE 04/13/2017 0757   LEUKOCYTESUR  NEGATIVE 04/13/2017 0757    STUDIES: No results found.   ELIGIBLE FOR AVAILABLE RESEARCH PROTOCOL: No  ASSESSMENT: 50 y.o. [place] woman status post right breast lower outer quadrant biopsy 03/23/2017 for a clinical T1b N0, stage IA invasive ductal carcinoma, grade 1, estrogen and progesterone receptor positive, HER-2 nonamplified, with an MIB-1 of 5%  (1) genetics testing 04/01/2017 through the"Common Hereditary Cancer Panel",performed at [facility], found no deleterious mutations in  PC, ATM, AXIN2, BARD1, BMPR1A, BRCA1, BRCA2, BRIP1, CDH1, CDKN2A (p14ARF), CDKN2A (p16INK4a), CHEK2, CTNNA1, DICER1, EPCAM (Deletion/duplication testing only), GREM1 (promoter region deletion/duplication testing only), KIT, MEN1, MLH1, MSH2, MSH3, MSH6, MUTYH, NBN, NF1, NHTL1, PALB2, PDGFRA, PMS2, POLD1, POLE, PTEN, RAD50, RAD51C, RAD51D, SDHB, SDHC, SDHD, SMAD4, SMARCA4. STK11, TP53, TSC1, TSC2, and VHL.    (2) Oncotype DX score of 14 predicts a 10 year outside the breast risk of recurrence of 9% if the patient's only systemic therapy is tamoxifen for 5 years. It also predicts no benefit from adjuvant chemotherapy.  (3) right lumpectomy and sentinel lymph node sampling 04/21/2017 showed a pT1b pN0, stage IA invasive ductal carcinoma, grade 1, with a focally present anterior margin for DCIS  (a) margins cleared with additional surgery 04/29/2017  (4) Adjuvant radiation 06/09/2017 to 07/27/2017  Site/dose:    1. The Right breast was treated to 50.4 Gy in 28 fractions of 1.8 Gy per fraction. 2. The Right breast was boosted to 10 Gy in 5 fractions of 2 Gy.    (5) tamoxifen started neoadjuvantly 04/01/2017 continued through surgery and radiation  (a) bone density at GYN's office normal June  2019   PLAN: Tani is now just over 4 years out from definitive surgery for her breast cancer with no evidence of disease recurrence.  This is very favorable.  She is tolerating tamoxifen well.  She will complete 5 years in June 2023.  We discussed the fact that I do not see any indication for her to continue beyond 5 years: Her prognosis is excellent and she will halved have her risk of developing another breast cancer in the future by completing those 5 years  Accordingly at that visit in June 2023 she will "graduate"  She knows to call for any other issue that may develop before then  Total encounter time 20 minutes.*   Justyn Langham, Virgie Dad, MD  08/12/21 10:04 AM Medical Oncology and Hematology [facility] [place], [place] [address] Tel. [phone]    Fax. [phone]    I, Wilburn Mylar, am acting as scribe for Dr. Virgie Dad. Vaughn Beaumier.  I, Lurline Del MD, have reviewed the above documentation for accuracy and completeness, and I agree with the above.   *Total Encounter Time as defined by the Centers for Medicare and Medicaid Services includes, in addition to the face-to-face time of a patient visit (documented in the note above) non-face-to-face time: obtaining and reviewing outside history, ordering and reviewing medications, tests or procedures, care coordination (communications with other health care professionals or caregivers) and documentation in the medical record.

## 2021-08-12 ENCOUNTER — Inpatient Hospital Stay: Payer: BC Managed Care – PPO | Admitting: Oncology

## 2021-08-12 ENCOUNTER — Other Ambulatory Visit: Payer: Self-pay

## 2021-08-12 ENCOUNTER — Inpatient Hospital Stay: Payer: BC Managed Care – PPO | Attending: Oncology

## 2021-08-12 VITALS — BP 115/67 | HR 89 | Temp 97.9°F | Resp 18 | Ht 65.0 in | Wt 241.1 lb

## 2021-08-12 DIAGNOSIS — C50311 Malignant neoplasm of lower-inner quadrant of right female breast: Secondary | ICD-10-CM | POA: Diagnosis not present

## 2021-08-12 DIAGNOSIS — Z923 Personal history of irradiation: Secondary | ICD-10-CM | POA: Diagnosis not present

## 2021-08-12 DIAGNOSIS — Z170 Estrogen receptor positive status [ER+]: Secondary | ICD-10-CM | POA: Insufficient documentation

## 2021-08-12 DIAGNOSIS — C50511 Malignant neoplasm of lower-outer quadrant of right female breast: Secondary | ICD-10-CM | POA: Insufficient documentation

## 2021-08-12 LAB — CMP (CANCER CENTER ONLY)
ALT: 19 U/L (ref 0–44)
AST: 20 U/L (ref 15–41)
Albumin: 3.4 g/dL — ABNORMAL LOW (ref 3.5–5.0)
Alkaline Phosphatase: 100 U/L (ref 38–126)
Anion gap: 8 (ref 5–15)
BUN: 12 mg/dL (ref 6–20)
CO2: 24 mmol/L (ref 22–32)
Calcium: 8.6 mg/dL — ABNORMAL LOW (ref 8.9–10.3)
Chloride: 107 mmol/L (ref 98–111)
Creatinine: 0.74 mg/dL (ref 0.44–1.00)
GFR, Estimated: >60 mL/min (ref >60)
Glucose, Bld: 90 mg/dL (ref 70–99)
Potassium: 4 mmol/L (ref 3.5–5.1)
Sodium: 139 mmol/L (ref 135–145)
Total Bilirubin: 0.3 mg/dL (ref 0.3–1.2)
Total Protein: 7.3 g/dL (ref 6.5–8.1)

## 2021-08-12 LAB — CBC WITH DIFFERENTIAL (CANCER CENTER ONLY)
Abs Immature Granulocytes: 0.01 10*3/uL (ref 0.00–0.07)
Basophils Absolute: 0 10*3/uL (ref 0.0–0.1)
Basophils Relative: 0 %
Eosinophils Absolute: 0.1 10*3/uL (ref 0.0–0.5)
Eosinophils Relative: 2 %
HCT: 38.2 % (ref 36.0–46.0)
Hemoglobin: 12.2 g/dL (ref 12.0–15.0)
Immature Granulocytes: 0 %
Lymphocytes Relative: 34 %
Lymphs Abs: 1.9 10*3/uL (ref 0.7–4.0)
MCH: 29 pg (ref 26.0–34.0)
MCHC: 31.9 g/dL (ref 30.0–36.0)
MCV: 91 fL (ref 80.0–100.0)
Monocytes Absolute: 0.6 10*3/uL (ref 0.1–1.0)
Monocytes Relative: 10 %
Neutro Abs: 3 10*3/uL (ref 1.7–7.7)
Neutrophils Relative %: 54 %
Platelet Count: 305 10*3/uL (ref 150–400)
RBC: 4.2 MIL/uL (ref 3.87–5.11)
RDW: 12.6 % (ref 11.5–15.5)
WBC Count: 5.6 10*3/uL (ref 4.0–10.5)
nRBC: 0 % (ref 0.0–0.2)

## 2021-08-12 MED ORDER — TAMOXIFEN CITRATE 20 MG PO TABS: 20.0000 mg | ORAL_TABLET | Freq: Every day | ORAL | 4 refills | Status: DC

## 2022-03-03 ENCOUNTER — Telehealth: Payer: Self-pay | Admitting: Hematology and Oncology

## 2022-03-03 NOTE — Telephone Encounter (Signed)
Rescheduled appointment per provider (virtual office visits only). Left message.

## 2022-03-28 ENCOUNTER — Other Ambulatory Visit: Payer: BC Managed Care – PPO

## 2022-03-28 ENCOUNTER — Ambulatory Visit: Payer: BC Managed Care – PPO | Admitting: Hematology and Oncology

## 2022-04-03 ENCOUNTER — Other Ambulatory Visit: Payer: Self-pay | Admitting: Obstetrics and Gynecology

## 2022-04-03 DIAGNOSIS — Z1231 Encounter for screening mammogram for malignant neoplasm of breast: Secondary | ICD-10-CM

## 2022-04-10 NOTE — Progress Notes (Signed)
Patient Care Team: Scifres, Earlie Server, PA-C (Inactive) as PCP - General (Physician Assistant) Everlene Farrier, MD as Consulting Physician (Obstetrics and Gynecology) Excell Seltzer, MD (Inactive) as Consulting Physician (General Surgery) Magrinat, Virgie Dad, MD (Inactive) as Consulting Physician (Oncology) Gery Pray, MD as Consulting Physician (Radiation Oncology)  DIAGNOSIS:  Encounter Diagnosis  Name Primary?   Malignant neoplasm of lower-inner quadrant of right breast of female, estrogen receptor positive ([place])     SUMMARY OF ONCOLOGIC HISTORY: Oncology History  Malignant neoplasm of lower-inner quadrant of right breast of female, estrogen receptor positive ([place])  03/31/2017 Initial Diagnosis   Malignant neoplasm of lower-inner quadrant of right breast of female, estrogen receptor positive ([place])   04/06/2017 Genetic Testing   Patient had genetic testing done for a personal and family history of breast cancer.  The Hereditary Breast Cancer STAT panel was sent out.  The STAT Breast cancer panel offered by [facility] includes sequencing and rearrangement analysis for the following 9 genes:  ATM, BRCA1, BRCA2, CDH1, CHEK2, PALB2, PTEN, STK11 and TP53. The date of this test report is April 06, 2017.   Reflex genetic testing was requested and the updated report date is April 07, 2017.  Reflex testing was order for the Common Hereditary Cancer Panel from [facility].  The Hereditary Gene Panel offered by [facility] includes sequencing and/or deletion duplication testing of the following 46 genes: APC, ATM, AXIN2, BARD1, BMPR1A, BRCA1, BRCA2, BRIP1, CDH1, CDKN2A (p14ARF), CDKN2A (p16INK4a), CHEK2, CTNNA1, DICER1, EPCAM (Deletion/duplication testing only), GREM1 (promoter region deletion/duplication testing only), KIT, MEN1, MLH1, MSH2, MSH3, MSH6, MUTYH, NBN, NF1, NHTL1, PALB2, PDGFRA, PMS2, POLD1, POLE, PTEN, RAD50, RAD51C, RAD51D, SDHB, SDHC, SDHD, SMAD4, SMARCA4. STK11, TP53, TSC1, TSC2, and VHL.  The  following genes were evaluated for sequence changes only: SDHA and HOXB13 c.251G>A variant only.   Results revealed patient has the following mutation(s): Negative- No pathogenic mutations or VUS's were identified in any of the 46 genes analyzed on the Common Hereditary Cancer Panel.      CHIEF COMPLIANT: Estrogen receptor positive breast cancer on Tamoxifen. Establish care with Dr. Lindi Adie  INTERVAL HISTORY: Ruth Nguyen is a 51 y.o with the above mention. She presents to the clinic today for a follow-up and establish care with Dr. Lindi Adie. She states that she had hot flashes weight gain and sleep disturbance. Dec. 29,30,31,2023 she had some abnormal bleeding. She had some concerns on how do you go off Tamoxifen and what is the side effects once you stop it. Her main concern is the weight gain. She said she does feel better today than she felt in a long time. She drinking plenty of water and doing some walking.   ALLERGIES:  is allergic to bupropion, citalopram hydrobromide, lexapro [escitalopram], penicillins, and sumatriptan.  MEDICATIONS:  Current Outpatient Medications  Medication Sig Dispense Refill   Acetaminophen (TYLENOL) 325 MG CAPS 1 capsule as needed     albuterol (ACCUNEB) 0.63 MG/3ML nebulizer solution Take 1 ampule by nebulization every 6 (six) hours as needed for wheezing.     amitriptyline (ELAVIL) 25 MG tablet Take 25 mg by mouth daily.  1   naproxen (NAPROSYN) 500 MG tablet Take 500 mg by mouth 2 (two) times daily.     No current facility-administered medications for this visit.    PHYSICAL EXAMINATION: ECOG PERFORMANCE STATUS: 1 - Symptomatic but completely ambulatory  Vitals:   04/24/22 0943  BP: (!) 137/91  Pulse: 86  Resp: 19  Temp: 97.9 F (36.6 C)  SpO2: 95%  Filed Weights   04/24/22 0943  Weight: 244 lb 9.6 oz (110.9 kg)    BREAST: No palpable masses or nodules in either right or left breasts. No palpable axillary supraclavicular or  infraclavicular adenopathy no breast tenderness or nipple discharge. (exam performed in the presence of a chaperone)  LABORATORY DATA:  I have reviewed the data as listed    Latest Ref Rng & Units 04/24/2022    9:10 AM 08/12/2021    9:01 AM 04/26/2020    1:06 PM  CMP  Glucose 70 - 99 mg/dL 110  90  116   BUN 6 - 20 mg/dL _0 Creatinine 0.44 - 1.00 mg/dL 0.74  0.74  0.75   Sodium 135 - 145 mmol/L 138  139  139   Potassium 3.5 - 5.1 mmol/L 3.6  4.0  3.8   Chloride 98 - 111 mmol/L 105  107  105   CO2 22 - 32 mmol/L _1 Calcium 8.9 - 10.3 mg/dL 9.0  8.6  8.8   Total Protein 6.5 - 8.1 g/dL 7.4  7.3  7.4   Total Bilirubin 0.3 - 1.2 mg/dL 0.3  0.3  0.3   Alkaline Phos 38 - 126 U/L 94  100  97   AST 15 - 41 U/L _2 ALT 0 - 44 U/L _3 Lab Results  Component Value Date   WBC 4.8 04/24/2022   HGB 12.6 04/24/2022   HCT 38.7 04/24/2022   MCV 90.2 04/24/2022   PLT 306 04/24/2022   NEUTROABS 2.4 04/24/2022    ASSESSMENT & PLAN:  Malignant neoplasm of lower-inner quadrant of right breast of female, estrogen receptor positive ([place]) status post right breast lower outer quadrant biopsy 03/23/2017 for a clinical T1b N0, stage IA invasive ductal carcinoma, grade 1, estrogen and progesterone receptor positive, HER-2 nonamplified, with an MIB-1 of 5%   (1) genetics testing 04/01/2017 through the"Common Hereditary Cancer Panel",performed at [facility], found no deleterious mutations   (2) Oncotype DX score of 14 predicts a 10 year outside the breast risk of recurrence of 9% if the patient's only systemic therapy is tamoxifen for 5 years. It also predicts no benefit from adjuvant chemotherapy.   (3) right lumpectomy and sentinel lymph node sampling 04/21/2017 showed a pT1b pN0, stage IA invasive ductal carcinoma, grade 1, with a focally present anterior margin for DCIS             (a) margins cleared with additional surgery 04/29/2017   (4)  Adjuvant radiation 06/09/2017 to 07/27/2017  (5) tamoxifen started neoadjuvantly 04/01/2017 continued through surgery and radiation ---------------------------------------------------------------------------------------------------------------------- Tamoxifen Toxicities: 1.  Weight gain 2. night sweats  She completes 5 years of therapy and therefore she will discontinue tamoxifen at this time.  She is hoping that she can lose weight with her diet changes and exercises.  If she does not we can refer her to the healthy weight loss clinic.  Breast Cancer Surveillance: 1. Breast Exam: 04/23/22: Benign 2. Mammogram Scheduled for 05/13/22  RTC on an as-needed basis.    No orders of the defined types were placed in this encounter.  The patient has a good understanding of the overall plan. she agrees with it. she will call with any problems that may develop before the next visit here. Total time spent: 30 mins including face to face time and time  spent for planning, charting and co-ordination of care   Harriette Ohara, MD 04/24/22    I Gardiner Coins am scribing for Dr. Lindi Adie  I have reviewed the above documentation for accuracy and completeness, and I agree with the above.

## 2022-04-23 NOTE — Assessment & Plan Note (Addendum)
status post right breast lower outer quadrant biopsy 03/23/2017 for a clinical T1b N0, stage IA invasive ductal carcinoma, grade 1, estrogen and progesterone receptor positive, HER-2 nonamplified, with an MIB-1 of 5%  (1) genetics testing 04/01/2017 through the"Common Hereditary Cancer Panel",performed at [facility], found no deleterious mutations   (2) Oncotype DX score of 14 predicts a 10 year outside the breast risk of recurrence of 9% if the patient's only systemic therapy is tamoxifen for 5 years. It also predicts no benefit from adjuvant chemotherapy.  (3) right lumpectomy and sentinel lymph node sampling 04/21/2017 showed a pT1b pN0, stage IA invasive ductal carcinoma, grade 1, with a focally present anterior margin for DCIS             (a) margins cleared with additional surgery 04/29/2017  (4) Adjuvant radiation 06/09/2017 to 07/27/2017 (5) tamoxifen started neoadjuvantly 04/01/2017 continued through surgery and radiation ---------------------------------------------------------------------------------------------------------------------- Tamoxifen Toxicities: 1.  Weight gain 2. night sweats  She completes 5 years of therapy and therefore she will discontinue tamoxifen at this time.  She is hoping that she can lose weight with her diet changes and exercises.  If she does not we can refer her to the healthy weight loss clinic.  Breast Cancer Surveillance: 1. Breast Exam: 04/23/22: Benign 2. Mammogram Scheduled for 05/13/22  RTC on an as-needed basis.

## 2022-04-24 ENCOUNTER — Inpatient Hospital Stay: Payer: BC Managed Care – PPO | Attending: Hematology and Oncology

## 2022-04-24 ENCOUNTER — Other Ambulatory Visit: Payer: Self-pay | Admitting: *Deleted

## 2022-04-24 ENCOUNTER — Other Ambulatory Visit: Payer: Self-pay

## 2022-04-24 ENCOUNTER — Inpatient Hospital Stay (HOSPITAL_BASED_OUTPATIENT_CLINIC_OR_DEPARTMENT_OTHER): Payer: BC Managed Care – PPO | Admitting: Hematology and Oncology

## 2022-04-24 DIAGNOSIS — N951 Menopausal and female climacteric states: Secondary | ICD-10-CM | POA: Diagnosis not present

## 2022-04-24 DIAGNOSIS — Z170 Estrogen receptor positive status [ER+]: Secondary | ICD-10-CM

## 2022-04-24 DIAGNOSIS — R635 Abnormal weight gain: Secondary | ICD-10-CM | POA: Diagnosis not present

## 2022-04-24 DIAGNOSIS — Z79899 Other long term (current) drug therapy: Secondary | ICD-10-CM | POA: Insufficient documentation

## 2022-04-24 DIAGNOSIS — C50311 Malignant neoplasm of lower-inner quadrant of right female breast: Secondary | ICD-10-CM

## 2022-04-24 DIAGNOSIS — Z853 Personal history of malignant neoplasm of breast: Secondary | ICD-10-CM | POA: Insufficient documentation

## 2022-04-24 LAB — CMP (CANCER CENTER ONLY)
ALT: 16 U/L (ref 0–44)
AST: 17 U/L (ref 15–41)
Albumin: 3.8 g/dL (ref 3.5–5.0)
Alkaline Phosphatase: 94 U/L (ref 38–126)
Anion gap: 7 (ref 5–15)
BUN: 11 mg/dL (ref 6–20)
CO2: 26 mmol/L (ref 22–32)
Calcium: 9 mg/dL (ref 8.9–10.3)
Chloride: 105 mmol/L (ref 98–111)
Creatinine: 0.74 mg/dL (ref 0.44–1.00)
GFR, Estimated: >60 mL/min (ref >60)
Glucose, Bld: 110 mg/dL — ABNORMAL HIGH (ref 70–99)
Potassium: 3.6 mmol/L (ref 3.5–5.1)
Sodium: 138 mmol/L (ref 135–145)
Total Bilirubin: 0.3 mg/dL (ref 0.3–1.2)
Total Protein: 7.4 g/dL (ref 6.5–8.1)

## 2022-04-24 LAB — CBC WITH DIFFERENTIAL (CANCER CENTER ONLY)
Abs Immature Granulocytes: 0.01 10*3/uL (ref 0.00–0.07)
Basophils Absolute: 0 10*3/uL (ref 0.0–0.1)
Basophils Relative: 0 %
Eosinophils Absolute: 0.1 10*3/uL (ref 0.0–0.5)
Eosinophils Relative: 2 %
HCT: 38.7 % (ref 36.0–46.0)
Hemoglobin: 12.6 g/dL (ref 12.0–15.0)
Immature Granulocytes: 0 %
Lymphocytes Relative: 40 %
Lymphs Abs: 1.9 10*3/uL (ref 0.7–4.0)
MCH: 29.4 pg (ref 26.0–34.0)
MCHC: 32.6 g/dL (ref 30.0–36.0)
MCV: 90.2 fL (ref 80.0–100.0)
Monocytes Absolute: 0.4 10*3/uL (ref 0.1–1.0)
Monocytes Relative: 8 %
Neutro Abs: 2.4 10*3/uL (ref 1.7–7.7)
Neutrophils Relative %: 50 %
Platelet Count: 306 10*3/uL (ref 150–400)
RBC: 4.29 MIL/uL (ref 3.87–5.11)
RDW: 12.7 % (ref 11.5–15.5)
WBC Count: 4.8 10*3/uL (ref 4.0–10.5)
nRBC: 0 % (ref 0.0–0.2)

## 2022-05-13 ENCOUNTER — Ambulatory Visit
Admission: RE | Admit: 2022-05-13 | Discharge: 2022-05-13 | Disposition: A | Discharge status: Home / Self Care | Payer: BC Managed Care – PPO | Source: Ambulatory Visit | Attending: Obstetrics and Gynecology | Admitting: Obstetrics and Gynecology

## 2022-05-13 DIAGNOSIS — Z1231 Encounter for screening mammogram for malignant neoplasm of breast: Secondary | ICD-10-CM

## 2023-04-03 ENCOUNTER — Other Ambulatory Visit: Payer: Self-pay | Admitting: Obstetrics and Gynecology

## 2023-04-03 DIAGNOSIS — Z1231 Encounter for screening mammogram for malignant neoplasm of breast: Secondary | ICD-10-CM

## 2023-05-15 ENCOUNTER — Ambulatory Visit: Admission: RE | Admit: 2023-05-15 | Payer: BC Managed Care – PPO | Source: Ambulatory Visit

## 2023-05-15 DIAGNOSIS — Z1231 Encounter for screening mammogram for malignant neoplasm of breast: Secondary | ICD-10-CM

## 2023-07-16 ENCOUNTER — Other Ambulatory Visit: Payer: Self-pay | Admitting: Sports Medicine

## 2023-07-16 DIAGNOSIS — M25512 Pain in left shoulder: Secondary | ICD-10-CM

## 2023-08-02 ENCOUNTER — Ambulatory Visit
Admission: RE | Admit: 2023-08-02 | Discharge: 2023-08-02 | Disposition: A | Discharge status: Home / Self Care | Payer: BC Managed Care – PPO | Source: Ambulatory Visit | Attending: Sports Medicine | Admitting: Sports Medicine

## 2023-08-02 DIAGNOSIS — M25512 Pain in left shoulder: Secondary | ICD-10-CM

## 2024-04-11 ENCOUNTER — Other Ambulatory Visit: Payer: Self-pay | Admitting: Obstetrics and Gynecology

## 2024-04-11 DIAGNOSIS — Z1231 Encounter for screening mammogram for malignant neoplasm of breast: Secondary | ICD-10-CM

## 2024-05-24 ENCOUNTER — Ambulatory Visit
Admission: RE | Admit: 2024-05-24 | Discharge: 2024-05-24 | Disposition: A | Discharge status: Home / Self Care | Payer: Self-pay | Source: Ambulatory Visit | Attending: Obstetrics and Gynecology | Admitting: Obstetrics and Gynecology

## 2024-05-24 DIAGNOSIS — Z1231 Encounter for screening mammogram for malignant neoplasm of breast: Secondary | ICD-10-CM
